# Patient Record
Sex: Female | Born: 1970
Health system: Southern US, Community
[De-identification: ages and names within clinical notes are randomized; demographics above are authoritative.]

## PROBLEM LIST (undated history)

## (undated) DIAGNOSIS — I10 Essential (primary) hypertension: Secondary | ICD-10-CM

## (undated) DIAGNOSIS — R011 Cardiac murmur, unspecified: Secondary | ICD-10-CM

## (undated) DIAGNOSIS — D219 Benign neoplasm of connective and other soft tissue, unspecified: Secondary | ICD-10-CM

## (undated) HISTORY — PX: HYSTEROSCOPY: SHX211

## (undated) HISTORY — DX: Cardiac murmur, unspecified: R01.1

## (undated) HISTORY — DX: Benign neoplasm of connective and other soft tissue, unspecified: D21.9

## (undated) HISTORY — PX: BACK SURGERY: SHX140

## (undated) HISTORY — PX: BREAST SURGERY: SHX581

## (undated) HISTORY — DX: Essential (primary) hypertension: I10

---

## 1998-01-18 ENCOUNTER — Other Ambulatory Visit: Admission: RE | Admit: 1998-01-18 | Discharge: 1998-01-18 | Payer: Self-pay | Admitting: Obstetrics and Gynecology

## 1998-03-19 ENCOUNTER — Other Ambulatory Visit: Admission: RE | Admit: 1998-03-19 | Discharge: 1998-03-19 | Payer: Self-pay | Admitting: Obstetrics and Gynecology

## 1999-01-08 ENCOUNTER — Ambulatory Visit (HOSPITAL_COMMUNITY): Admission: RE | Admit: 1999-01-08 | Discharge: 1999-01-08 | Payer: Self-pay | Admitting: Obstetrics and Gynecology

## 1999-01-08 ENCOUNTER — Encounter: Payer: Self-pay | Admitting: Obstetrics and Gynecology

## 1999-02-11 ENCOUNTER — Ambulatory Visit (HOSPITAL_COMMUNITY): Admission: RE | Admit: 1999-02-11 | Discharge: 1999-02-11 | Payer: Self-pay | Admitting: Obstetrics and Gynecology

## 1999-02-16 ENCOUNTER — Emergency Department (HOSPITAL_COMMUNITY): Admission: EM | Admit: 1999-02-16 | Discharge: 1999-02-16 | Payer: Self-pay | Admitting: Emergency Medicine

## 1999-02-16 ENCOUNTER — Encounter: Payer: Self-pay | Admitting: Emergency Medicine

## 2001-02-23 ENCOUNTER — Other Ambulatory Visit: Admission: RE | Admit: 2001-02-23 | Discharge: 2001-02-23 | Payer: Self-pay | Admitting: Obstetrics and Gynecology

## 2002-03-31 ENCOUNTER — Other Ambulatory Visit: Admission: RE | Admit: 2002-03-31 | Discharge: 2002-03-31 | Payer: Self-pay | Admitting: Obstetrics and Gynecology

## 2003-05-10 ENCOUNTER — Other Ambulatory Visit: Admission: RE | Admit: 2003-05-10 | Discharge: 2003-05-10 | Payer: Self-pay | Admitting: Obstetrics and Gynecology

## 2005-12-31 ENCOUNTER — Ambulatory Visit: Payer: Self-pay | Admitting: General Practice

## 2007-01-06 ENCOUNTER — Ambulatory Visit: Payer: Self-pay | Admitting: General Practice

## 2007-01-08 ENCOUNTER — Ambulatory Visit: Payer: Self-pay | Admitting: General Practice

## 2008-09-28 ENCOUNTER — Ambulatory Visit: Payer: Self-pay | Admitting: Gynecology

## 2008-09-28 ENCOUNTER — Other Ambulatory Visit: Admission: RE | Admit: 2008-09-28 | Discharge: 2008-09-28 | Payer: Self-pay | Admitting: Gynecology

## 2008-09-28 ENCOUNTER — Encounter: Payer: Self-pay | Admitting: Gynecology

## 2008-10-16 ENCOUNTER — Ambulatory Visit: Payer: Self-pay | Admitting: Gynecology

## 2009-06-29 ENCOUNTER — Ambulatory Visit: Payer: Self-pay | Admitting: Gynecology

## 2009-07-04 ENCOUNTER — Ambulatory Visit: Payer: Self-pay | Admitting: Gynecology

## 2009-09-05 ENCOUNTER — Ambulatory Visit (HOSPITAL_COMMUNITY): Admission: RE | Admit: 2009-09-05 | Discharge: 2009-09-05 | Payer: Self-pay | Admitting: Endocrinology

## 2010-03-06 ENCOUNTER — Ambulatory Visit: Payer: Self-pay | Admitting: Gynecology

## 2010-05-13 ENCOUNTER — Other Ambulatory Visit: Payer: Self-pay | Admitting: Gynecology

## 2010-05-13 ENCOUNTER — Other Ambulatory Visit (HOSPITAL_COMMUNITY)
Admission: RE | Admit: 2010-05-13 | Discharge: 2010-05-13 | Disposition: A | Discharge status: Home / Self Care | Payer: BC Managed Care – PPO | Source: Ambulatory Visit | Attending: Gynecology | Admitting: Gynecology

## 2010-05-13 ENCOUNTER — Encounter (INDEPENDENT_AMBULATORY_CARE_PROVIDER_SITE_OTHER): Payer: BC Managed Care – PPO | Admitting: Gynecology

## 2010-05-13 DIAGNOSIS — Z13220 Encounter for screening for lipoid disorders: Secondary | ICD-10-CM

## 2010-05-13 DIAGNOSIS — R82998 Other abnormal findings in urine: Secondary | ICD-10-CM

## 2010-05-13 DIAGNOSIS — Z124 Encounter for screening for malignant neoplasm of cervix: Secondary | ICD-10-CM | POA: Insufficient documentation

## 2010-05-13 DIAGNOSIS — Z01419 Encounter for gynecological examination (general) (routine) without abnormal findings: Secondary | ICD-10-CM

## 2010-05-13 DIAGNOSIS — Z113 Encounter for screening for infections with a predominantly sexual mode of transmission: Secondary | ICD-10-CM

## 2010-05-13 DIAGNOSIS — B373 Candidiasis of vulva and vagina: Secondary | ICD-10-CM

## 2010-05-13 DIAGNOSIS — Z833 Family history of diabetes mellitus: Secondary | ICD-10-CM

## 2010-06-02 LAB — 17-HYDROXYPREGNENOLONE
17-Hydroxycorticosteroid: 1020 ng/dL — ABNORMAL HIGH (ref <=226)
17-Hydroxycorticosteroid: 237 ng/dL — ABNORMAL HIGH (ref <=226)

## 2010-06-02 LAB — DHEA-SULFATE: DHEA-SO4: 44 ug/dL (ref 35–430)

## 2010-06-02 LAB — PROGESTERONE
Progesterone: 0.7 ng/mL
Progesterone: 0.9 ng/mL

## 2010-06-02 LAB — ANDROSTENEDIONE: Androstenedione: 89 ng/dL

## 2010-06-02 LAB — TESTOSTERONE: Testosterone: 48.9 ng/dL (ref 10–70)

## 2010-08-23 ENCOUNTER — Ambulatory Visit (INDEPENDENT_AMBULATORY_CARE_PROVIDER_SITE_OTHER): Payer: BC Managed Care – PPO | Admitting: Gynecology

## 2010-08-23 DIAGNOSIS — Z3041 Encounter for surveillance of contraceptive pills: Secondary | ICD-10-CM

## 2010-10-10 ENCOUNTER — Other Ambulatory Visit: Payer: Self-pay | Admitting: Gynecology

## 2010-10-14 ENCOUNTER — Telehealth: Payer: Self-pay | Admitting: *Deleted

## 2010-10-14 NOTE — Telephone Encounter (Signed)
PT INFORMED WITH THE BELOW NOTE.SHE SAID SHE IS GOING TO STOP BCP COMPLETELY  PT INSTUCTED TO USE BACK UP CONTRACEPTION PER TF. SHE STATES SHE WILL CALL BACK TO MAKE APPOINTMENT.

## 2010-10-14 NOTE — Telephone Encounter (Signed)
bruising is usually not an issue with birth control pills but blood clots are. I doubt that this is related. She stopped her pills so make sure she uses backup contraception and she can make an appointment to come in and we can discuss options.

## 2010-10-14 NOTE — Telephone Encounter (Signed)
PT CALLING STATING THAT SHE NOTICED A BLACK BRUISE ON HER ARM NEAR ARMPIT. IT DOES'NT HURT BUT PT STATES THAT SHE SAW THAT IT WAS A SIDE EFFECT FROM HER BCP LOESTRIN 1/20. SHE STOP TAKING HER PILL YESTERDAY DUE TO THE BRUISE. ALSO C/O SPOTTING OFF &ON AS WELL WITH BCP LMP:10/01/10 (PT LAST OV 08/23/10) PLEASE ADVISE.

## 2010-11-01 ENCOUNTER — Ambulatory Visit (INDEPENDENT_AMBULATORY_CARE_PROVIDER_SITE_OTHER): Payer: BC Managed Care – PPO | Admitting: Gynecology

## 2010-11-01 ENCOUNTER — Encounter: Payer: Self-pay | Admitting: Gynecology

## 2010-11-01 DIAGNOSIS — Z3041 Encounter for surveillance of contraceptive pills: Secondary | ICD-10-CM

## 2010-11-01 DIAGNOSIS — L819 Disorder of pigmentation, unspecified: Secondary | ICD-10-CM

## 2010-11-01 DIAGNOSIS — L811 Chloasma: Secondary | ICD-10-CM

## 2010-11-01 MED ORDER — NORETHIN-ETH ESTRAD-FE BIPHAS 1 MG-10 MCG / 10 MCG PO TABS: 1.0000 | ORAL_TABLET | Freq: Every day | ORAL | Status: DC

## 2010-11-01 NOTE — Progress Notes (Signed)
Patient presents having started on Loestrin 1/20 oral contraceptives and noticed a pigmented splotches occurring on her arms. She discontinued the pills after just being on them for short period time and these areas have resolved.   Exam: Upper extremities show one small patch of hyperpigmentation left forearm otherwise normal.  Discussed with patient the issues of chloasma and BCPs. Argument as to whether this is a estrogen versus progesterone effect. She was on a low dose pill to start and I discussed it is unusual to see it with this low dose. Options for management were reviewed to trying a different pillow either a lower dose pill or switching progesterones possibly trying NuvaRing and IUD. We both agree on trying Lo Loestrin Fe and I prescribed her a six-month course and gave her a discount card. If she has issues again with chloasma once starting these and/or other problems she will call me.

## 2011-02-10 ENCOUNTER — Ambulatory Visit (INDEPENDENT_AMBULATORY_CARE_PROVIDER_SITE_OTHER): Payer: BC Managed Care – PPO | Admitting: Gynecology

## 2011-02-10 ENCOUNTER — Encounter: Payer: Self-pay | Admitting: Gynecology

## 2011-02-10 VITALS — BP 150/90

## 2011-02-10 DIAGNOSIS — N898 Other specified noninflammatory disorders of vagina: Secondary | ICD-10-CM

## 2011-02-10 DIAGNOSIS — B373 Candidiasis of vulva and vagina: Secondary | ICD-10-CM

## 2011-02-10 DIAGNOSIS — N949 Unspecified condition associated with female genital organs and menstrual cycle: Secondary | ICD-10-CM

## 2011-02-10 DIAGNOSIS — N938 Other specified abnormal uterine and vaginal bleeding: Secondary | ICD-10-CM

## 2011-02-10 DIAGNOSIS — Z113 Encounter for screening for infections with a predominantly sexual mode of transmission: Secondary | ICD-10-CM

## 2011-02-10 DIAGNOSIS — Z3041 Encounter for surveillance of contraceptive pills: Secondary | ICD-10-CM

## 2011-02-10 DIAGNOSIS — B9689 Other specified bacterial agents as the cause of diseases classified elsewhere: Secondary | ICD-10-CM

## 2011-02-10 DIAGNOSIS — A499 Bacterial infection, unspecified: Secondary | ICD-10-CM

## 2011-02-10 DIAGNOSIS — N760 Acute vaginitis: Secondary | ICD-10-CM

## 2011-02-10 MED ORDER — METRONIDAZOLE 500 MG PO TABS: 500.0000 mg | ORAL_TABLET | Freq: Two times a day (BID) | ORAL | Status: AC

## 2011-02-10 NOTE — Progress Notes (Signed)
Patient presents wanting STD screening. Her partner has been unfaithful and she wants to be screened. She has no known exposure. She also noted some spotting intermittently on her Loestrin 120 equivalent. She has a history of mild high blood pressure at her visit in June when she started the pills it was 140/80. She never followed back up to have her rechecked as instructed.  Exam Blood pressure 150/90 Pelvic external BUS vagina with white discharge, cervix normal, uterus normal size midline mobile nontender, adnexa without masses or tenderness  Assessment and plan: 1. White discharge. KOH wet prep is positive for BV. We'll treat with Flagyl 500 twice a day x7 days alcohol with this discussed and accepted. 2. STD screening. GC Chlamydia screen was done. HIV RPR hepatitis B and hepatitis C were ordered. 3. Contraceptive management. Reviewed all the blood pressure the patient and I recommended she follow up with the internist to be evaluated and possibly treated she is having persistent hypertension. I gave her the name of the [facility] and recommended she call make an appointment to see them in follow up with them and the importance was urged. I did recommend at least at this point she stay on the birth control pill. I reviewed with her the risks of the pill in association with hypertension to include stroke heart attack DVT. I fear would be to stop the pill now she [place] pregnancy and have the combined risk of hypertension in pregnancy. I did recommend that long-term we switch her to an alternative birth control and I gave her literature on the Mirena IUD strongly urged her to consider this but also reviewed other options with her and she wants to think about this and will follow up for this but again she clearly understands my recommendation that we need to get her off of the oral contraceptive particularly if she remains with elevated blood pressures and she accepts the short-term risk of stroke heart  attack DVT but remaining on it.

## 2011-02-10 NOTE — Progress Notes (Signed)
rx called in due to escribe down. 

## 2011-02-11 LAB — HEPATITIS C ANTIBODY: HCV Ab: NEGATIVE

## 2011-02-11 LAB — HIV ANTIBODY (ROUTINE TESTING W REFLEX): HIV: NONREACTIVE

## 2011-03-18 DIAGNOSIS — D219 Benign neoplasm of connective and other soft tissue, unspecified: Secondary | ICD-10-CM

## 2011-03-18 HISTORY — DX: Benign neoplasm of connective and other soft tissue, unspecified: D21.9

## 2011-05-06 ENCOUNTER — Ambulatory Visit (INDEPENDENT_AMBULATORY_CARE_PROVIDER_SITE_OTHER): Payer: BC Managed Care – PPO | Admitting: Gynecology

## 2011-05-06 ENCOUNTER — Encounter: Payer: Self-pay | Admitting: Gynecology

## 2011-05-06 DIAGNOSIS — A499 Bacterial infection, unspecified: Secondary | ICD-10-CM

## 2011-05-06 DIAGNOSIS — N898 Other specified noninflammatory disorders of vagina: Secondary | ICD-10-CM

## 2011-05-06 DIAGNOSIS — L293 Anogenital pruritus, unspecified: Secondary | ICD-10-CM

## 2011-05-06 DIAGNOSIS — B9689 Other specified bacterial agents as the cause of diseases classified elsewhere: Secondary | ICD-10-CM

## 2011-05-06 DIAGNOSIS — N760 Acute vaginitis: Secondary | ICD-10-CM

## 2011-05-06 DIAGNOSIS — Z309 Encounter for contraceptive management, unspecified: Secondary | ICD-10-CM

## 2011-05-06 LAB — WET PREP FOR TRICH, YEAST, CLUE

## 2011-05-06 MED ORDER — METRONIDAZOLE 500 MG PO TABS: 500.0000 mg | ORAL_TABLET | Freq: Two times a day (BID) | ORAL | Status: AC

## 2011-05-06 NOTE — Progress Notes (Signed)
Patient presents with several days of vaginal itching. Also was discussed tubal sterilization.  Exam with Amy chaperone present Pelvic external BUS vagina with white discharge. Cervix normal. Uterus normal size midline mobile nontender. Adnexa without masses or tenderness  Assessment and plan: 1. Vulvar itching wet prep is positive for BV.  We'll treat with Flagyl 500 twice a day x7 days, alcohol avoidance discussed. 2. Desires sterilization. I reviewed options with her to include pill patch ring Implanon Depo-Provera Mirena IUD sterilization with vasectomy and tubal and hysteroscopic sterilization. The issues of permanency of sterilization and a regret issue was reviewed and the patient wants to proceed with tubal sterilization last I will go and schedule this. She is being seen for her blood pressure and has an appointment this week to see her doctor. We'll move toward scheduling the sterilization and discontinuing her birth control pills after.

## 2011-05-06 NOTE — Patient Instructions (Signed)
Take antibiotics as prescribed. Office will call you to arrange for tubal sterilization.

## 2011-05-07 ENCOUNTER — Telehealth: Payer: Self-pay

## 2011-05-07 NOTE — Telephone Encounter (Signed)
Patient called regarding scheduling lap tubal. I told her I had checked ins benefits and her upfront cost to [facility] is $612.  She said she has to see PCP and get her BP managed before she can have surgery per Dr. Velvet Bathe.  She said she was thinking that she would like to try to do it June or July. I told her I did not have schedule that far out but i will call her as soon as I get June schedule.  I will hold her chart and call her.

## 2011-05-07 NOTE — Telephone Encounter (Signed)
Left message pt to call me to discuss scheduling outpatient surgery.

## 2011-05-09 ENCOUNTER — Encounter: Payer: Self-pay | Admitting: Family Medicine

## 2011-05-20 ENCOUNTER — Telehealth: Payer: Self-pay

## 2011-05-20 NOTE — Telephone Encounter (Signed)
I think that that would be fine to schedule her she does not have malignant hypertension but mild elevations. Certainly not unreasonable to tie her tubes in and see what her blood pressure is off of oral contraceptives. I suspect that she will still need to be treated long term.

## 2011-05-20 NOTE — Telephone Encounter (Signed)
Patient called and said she is financially able to go ahead and do her Lap Ster now instead of waiting until June. She had previously told me she needed to follow-up with primary MD the following week regarding her BP.  You had noted back 01/2011 her persistent hypertension and stressed to her the importance of seeing primary care to possibly be put on meds.  She said she has appt with PA to have her BP rechecked and possibly go on medication but she would like to do tubal ligation first.  I asked her why and she said in case OC's are what's causing her BP elevation after Lap Ster she can d/c them and see if that makes a difference before BP meds.  I told her that I felt sure you and anesthesia will want her BP regulated before surgery but I would run it by you.

## 2011-05-21 NOTE — Telephone Encounter (Signed)
Patient informed.  She needs to work it around her boyfriend's schedule so he can bring her, etc. So she will talk with him and call me when she knows when she wants to schedule.

## 2011-05-27 ENCOUNTER — Other Ambulatory Visit: Payer: Self-pay | Admitting: Gynecology

## 2011-07-22 ENCOUNTER — Encounter: Payer: BC Managed Care – PPO | Admitting: Gynecology

## 2011-08-12 ENCOUNTER — Encounter: Payer: BC Managed Care – PPO | Admitting: Gynecology

## 2011-09-28 ENCOUNTER — Other Ambulatory Visit: Payer: Self-pay | Admitting: Gynecology

## 2011-09-29 NOTE — Telephone Encounter (Signed)
Left message for patient to call me. Told her  5 months overdue for CE and we must schedule her before I can refill one pack.  I encouraged her to schedule for a time she can keep appt as this may be the last time w can extend her RX.

## 2011-09-29 NOTE — Telephone Encounter (Signed)
Patient called and scheduled CE for next week. She is with menses this week. Encouraged her to keep appointment so Dr. Velvet Bathe can give her new OC Rx for a year.

## 2011-10-10 ENCOUNTER — Encounter: Payer: BC Managed Care – PPO | Admitting: Gynecology

## 2011-10-17 ENCOUNTER — Ambulatory Visit (INDEPENDENT_AMBULATORY_CARE_PROVIDER_SITE_OTHER): Payer: BC Managed Care – PPO | Admitting: Gynecology

## 2011-10-17 ENCOUNTER — Encounter: Payer: Self-pay | Admitting: Gynecology

## 2011-10-17 VITALS — BP 128/80 | Ht 64.5 in | Wt 191.0 lb

## 2011-10-17 DIAGNOSIS — N925 Other specified irregular menstruation: Secondary | ICD-10-CM

## 2011-10-17 DIAGNOSIS — N949 Unspecified condition associated with female genital organs and menstrual cycle: Secondary | ICD-10-CM

## 2011-10-17 DIAGNOSIS — N938 Other specified abnormal uterine and vaginal bleeding: Secondary | ICD-10-CM

## 2011-10-17 DIAGNOSIS — Z01419 Encounter for gynecological examination (general) (routine) without abnormal findings: Secondary | ICD-10-CM

## 2011-10-17 MED ORDER — DROSPIRENONE-ETHINYL ESTRADIOL 3-0.02 MG PO TABS: 1.0000 | ORAL_TABLET | Freq: Every day | ORAL | Status: DC

## 2011-10-17 NOTE — Progress Notes (Signed)
Sherry Sampson 01-Jun-1970 [id number]        41 y.o.  G1P1 for annual exam.  Several issues noted below.  Past medical history,surgical history, medications, allergies, family history and social history were all reviewed and documented in the EPIC chart. ROS:  Was performed and pertinent positives and negatives are included in the history.  Exam: Sherry Sampson assistant Filed Vitals:   10/17/11 1547  BP: 128/80  Height: 5' 4.5" (1.638 m)  Weight: 191 lb (86.637 kg)   General appearance  Normal Skin grossly normal Head/Neck normal with no cervical or supraclavicular adenopathy thyroid normal Lungs  clear Cardiac RR, without RMG Abdominal  soft, nontender, without masses, organomegaly or hernia Breasts  examined lying and sitting without masses, retractions, discharge or axillary adenopathy. Pelvic  Ext/BUS/vagina  normal   Cervix  normal with light menses flow  Uterus  anteverted, normal size, shape and contour, midline and mobile nontender   Adnexa  Without masses or tenderness    Anus and perineum  normal   Rectovaginal  normal sphincter tone without palpated masses or tenderness.    Assessment/Plan:  41 y.o. G1P1 female for annual exam.   1. Breakthrough bleeding. Patient notes her last several months midcycle breakthrough bleeding as she is doing now. Does have a history of D&C a number of years ago although cannot remember when and I do not have records of this. Currently on Lo Loestrin. Reviewed possibilities to include hormonal dysfunction versus structural such as polyps. Options for management reviewed and ultimately we decided on changing birth control pills initially. If bleeding continues then consider sonohysterogram to rule out structural abnormalities and she knows to call.  We'll change to Yaz with one-year prescription written. Had been on Desogen in the past and apparently did well with this. I reviewed with her the risks of pills in general and particularly with drospirenone  containing pills about the increased risk of stroke heart attack DVT.  She doesn't smoke and has well-controlled hypertension. Patient understands accepts the risks. 2. Hirsutism. Evaluation in the past showed normal testosterone, DHEAS, ACTH.  She did have an elevated 17 hydroxyprogesterone in the 300-400 range on repeat. She had seen Dr. Horald Pollen did a stimulation test which was reported normal and suggested electrolysis as far as the hirsutism. She is going to continue on her birth control pills and theoretically drospirenone should help as far as hirsutism. 3. Pap smear. Last Pap smear 2012. No Pap smear done today. She has no history of abnormal Pap smears before. Discussed current screening guidelines we'll plan less frequent screening every 3-5 years. 4. STD screening offered and declined. 5. Breast health. Patient reports baseline mammogram several years ago. We'll repeat at age 20. SBE monthly. 6. Health maintenance. Patient reports her primary does all of her blood work. She did have a normal glucose lipid profile CBC urinalysis last year in 2012 here. Follow up in one year, sooner as needed.    Sherry Lords MD, 4:43 PM 10/17/2011

## 2011-10-17 NOTE — Patient Instructions (Signed)
Switch to new birth control pills. Assuming bleeding resolves then we'll follow. If bleeding would continue then let me know we will schedule an ultrasound.

## 2011-10-28 ENCOUNTER — Encounter: Payer: Self-pay | Admitting: Gynecology

## 2011-10-31 ENCOUNTER — Telehealth: Payer: Self-pay | Admitting: *Deleted

## 2011-10-31 NOTE — Telephone Encounter (Signed)
I would go ahead and switch over to Yaz and if breakthrough bleeding continues then scheduled sonohysterogram. If it resolves and she has regular monthly menses then will follow.

## 2011-10-31 NOTE — Telephone Encounter (Signed)
Pt informed with the below note and will follow up as needed. 

## 2011-10-31 NOTE — Telephone Encounter (Signed)
Pt calling still c/o breakthru bleeding left side pain is much less, she never started on Yaz prescribed at OV 10/17/11. Pt continued to take the lo Loestrin because she said those pills price at $25.00 and she didn't want to throw them away. Her last pill of lo Loestrin was on Tuesday, she still has not started Yaz yet. Pt asked did you want her to schedule ultrasound as directed if bleeding continues or start on yaz? Please advise

## 2011-11-11 ENCOUNTER — Telehealth: Payer: Self-pay | Admitting: *Deleted

## 2011-11-11 DIAGNOSIS — N926 Irregular menstruation, unspecified: Secondary | ICD-10-CM

## 2011-11-11 NOTE — Telephone Encounter (Addendum)
Pt informed with the below note, order placed. 

## 2011-11-11 NOTE — Telephone Encounter (Signed)
Follow up conversation telephone encounter 10/31/11, pt started yaz on 11/01/11 no bleeding nor left side discomfort for 1 week. Pt said yesterday evening she started to have bleeding again with the left side discomfort again. Should pt schedule SHGM or continue with trial of yaz? Please advise

## 2011-11-11 NOTE — Telephone Encounter (Signed)
I would go ahead and schedule the sonohysterogram. Will allow [place] to look at her pelvis, ovaries and endometrial cavity. Continue on the yaz regardless.

## 2011-11-12 ENCOUNTER — Ambulatory Visit: Payer: BC Managed Care – PPO | Admitting: Gynecology

## 2011-11-12 ENCOUNTER — Other Ambulatory Visit: Payer: Self-pay | Admitting: Gynecology

## 2011-11-12 ENCOUNTER — Other Ambulatory Visit: Payer: BC Managed Care – PPO

## 2011-11-12 DIAGNOSIS — N926 Irregular menstruation, unspecified: Secondary | ICD-10-CM

## 2011-11-21 ENCOUNTER — Ambulatory Visit (INDEPENDENT_AMBULATORY_CARE_PROVIDER_SITE_OTHER): Payer: BC Managed Care – PPO | Admitting: Gynecology

## 2011-11-21 ENCOUNTER — Ambulatory Visit: Payer: BC Managed Care – PPO

## 2011-11-21 ENCOUNTER — Ambulatory Visit: Payer: BC Managed Care – PPO | Admitting: Gynecology

## 2011-11-21 ENCOUNTER — Encounter: Payer: Self-pay | Admitting: Gynecology

## 2011-11-21 ENCOUNTER — Ambulatory Visit (INDEPENDENT_AMBULATORY_CARE_PROVIDER_SITE_OTHER): Payer: BC Managed Care – PPO

## 2011-11-21 DIAGNOSIS — N949 Unspecified condition associated with female genital organs and menstrual cycle: Secondary | ICD-10-CM

## 2011-11-21 DIAGNOSIS — D259 Leiomyoma of uterus, unspecified: Secondary | ICD-10-CM

## 2011-11-21 DIAGNOSIS — N926 Irregular menstruation, unspecified: Secondary | ICD-10-CM

## 2011-11-21 DIAGNOSIS — N938 Other specified abnormal uterine and vaginal bleeding: Secondary | ICD-10-CM

## 2011-11-21 NOTE — Progress Notes (Signed)
Patient follows up for sonohysterogram due to persistent irregular bleeding. She just recently switched to Yaz birth-control pills 2 weeks ago. Ultrasound initially shows normal uterine size and echotexture. 2 small myomas 11 x 7 mm and 10 x 7 mm.  Endometrial echo 4.8 mm. Right and left ovaries visualized and normal. Cul-de-sac negative. Sonohysterogram performed, sterile technique, easy catheter introduction, good distention with no abnormalities. Endometrial sample taken. Patient tolerated well.  Assessment and plan: Dysfunctional bleeding recent switching birth control pills. Ultrasound overall negative with small incidental myomas. She'll follow up for biopsy results. Assuming acceptable will keep menstrual calendar on new birth control pill over the next 2 cycles call if irregular bleeding continues.

## 2011-11-21 NOTE — Patient Instructions (Signed)
Office will call you with the biopsy results 

## 2012-02-19 ENCOUNTER — Telehealth: Payer: Self-pay | Admitting: *Deleted

## 2012-02-19 MED ORDER — NORETHINDRONE-ETH ESTRADIOL 1-35 MG-MCG PO TABS: 1.0000 | ORAL_TABLET | Freq: Every day | ORAL | Status: DC

## 2012-02-19 NOTE — Telephone Encounter (Signed)
Pt called back to follow regarding bleeding while on Yaz birth control pill. Pt said she is still spotting & bleeding a 1 1/2 week prior to when her period should start. She is requesting to start on different pill where she could have cycle only every 3 months?  Pt states " I am sick of bleeding" please advise

## 2012-02-19 NOTE — Telephone Encounter (Signed)
Recommend trial of a little higher dose pill with Ortho-Novum 1/35 equivalent. Trial of 3 months with call back at that time to see how she's doing. If doing well then we'll refill her through her next annual.

## 2012-02-20 NOTE — Telephone Encounter (Signed)
Pt informed with the below note and will call back as directed.

## 2012-03-15 ENCOUNTER — Telehealth: Payer: Self-pay | Admitting: *Deleted

## 2012-03-15 NOTE — Telephone Encounter (Signed)
Finish pack she is on and then start the appropriate Ortho-Novum 1/35 equivalent with her next pack.

## 2012-03-15 NOTE — Telephone Encounter (Signed)
Pt was given Rx for Ortho-Novum 1/35 equivalent on 02/19/12. She took only 1 pack of these pills. Pharmacy gave pt wrong pills 2 packs of her old Rx  Yaz and pt didn't realize this until now. She took 1 whole pack of yaz and just started today her 2 pack of yaz. She still having irregular bleeding as noted on 02/19/12. Please advise how pt should handle.

## 2012-03-15 NOTE — Telephone Encounter (Signed)
Pt informed with the below note. 

## 2012-03-24 ENCOUNTER — Telehealth: Payer: Self-pay | Admitting: *Deleted

## 2012-03-24 NOTE — Telephone Encounter (Signed)
Pt called c/o heavy bleeding today and stomach swelling. Ov advised for pt.

## 2012-04-05 ENCOUNTER — Encounter: Payer: Self-pay | Admitting: Gynecology

## 2012-04-05 ENCOUNTER — Ambulatory Visit (INDEPENDENT_AMBULATORY_CARE_PROVIDER_SITE_OTHER): Payer: PRIVATE HEALTH INSURANCE | Admitting: Gynecology

## 2012-04-05 DIAGNOSIS — N938 Other specified abnormal uterine and vaginal bleeding: Secondary | ICD-10-CM

## 2012-04-05 DIAGNOSIS — N949 Unspecified condition associated with female genital organs and menstrual cycle: Secondary | ICD-10-CM

## 2012-04-05 NOTE — Patient Instructions (Signed)
Continue on birth control pills. Call if irregular bleeding continues. Follow up in August for annual exam.

## 2012-04-05 NOTE — Progress Notes (Signed)
Patient presents complaining of an episode of heavy vaginal bleeding. We saw her in August 2013 with some breakthrough bleeding. She ultimately had a negative sonohysterogram with negative endometrial biopsy. She was placed on Yaz times several cycles but continued to have the breakthrough bleeding and we ultimately switched her to Ortho-Novum 1/35's but she has not started these yet to a mixup in the pharmacy and she had continued on the as and is at the very end of this pack. Most recently she started to have some breakthrough bleeding and passed a large clot which scared her into this appointment.  Exam with Kim assistant Abdomen soft nontender without masses guarding rebound organomegaly. Pelvic external BUS vagina normal. Cervix normal. Uterus normal size midline mobile nontender. Adnexa without masses or tenderness.  Assessment and plan:  Breakthrough bleeding on birth control pills. Sonohysterogram and endometrial biopsy negative. Had been placed on Yaz and then switched to Ortho-Novum 1/35 but has not done this yet. She is at the end of her Yaz pac. I recommended that as she is no longer bleeding to start on the Ortho-Novum 1/35 and then we'll see how she does over the first couple cycles.  If she continues to bleed irregularly she'll represent for evaluation. If she has regular menses and will follow up in August when she is in for her annual.

## 2012-04-15 ENCOUNTER — Telehealth: Payer: Self-pay | Admitting: *Deleted

## 2012-04-15 NOTE — Telephone Encounter (Signed)
Pt informed with the below note and will follow up if needed.

## 2012-04-15 NOTE — Telephone Encounter (Signed)
Pt is calling to follow regarding irregular bleeding. Pt has started the ortho-novum about a week ago, and started spotting this a.m. It is not a cycle flow, only spotting. Pt asked this to be relayed to you. Please advise

## 2012-04-15 NOTE — Telephone Encounter (Signed)
She is just started on a new type of pill understand these for now do not stop them and we'll see how she does in one to 2 months. Sometimes breakthrough bleeding is not unusual when starting a new type of pill.

## 2012-05-17 ENCOUNTER — Telehealth: Payer: Self-pay | Admitting: *Deleted

## 2012-05-17 MED ORDER — NORETHINDRONE-ETH ESTRADIOL 1-35 MG-MCG PO TABS: 1.0000 | ORAL_TABLET | Freq: Every day | ORAL | Status: DC

## 2012-05-17 NOTE — Telephone Encounter (Signed)
Pt informed, Rx sent. 

## 2012-05-17 NOTE — Telephone Encounter (Signed)
Pt is calling to follow with last telephone encounter 04/15/12. She is requesting refill on Ortho-Novum 1/35, but she still having breakthrough bleeding. Pt said it would stop for about 3 days and then start again. But this morning she filled up her panty liner. She has not be bleeding heavy since taking pills, only this am would be the first time were bleeding was more. Please advise

## 2012-05-17 NOTE — Telephone Encounter (Signed)
Refill x6 were to get her to August when she is due for her annual. If she still continues to have breakthrough bleeding after several packs Ortho-Novum 135 call.

## 2012-05-28 ENCOUNTER — Ambulatory Visit: Payer: PRIVATE HEALTH INSURANCE | Admitting: Gynecology

## 2012-06-17 ENCOUNTER — Telehealth: Payer: Self-pay | Admitting: *Deleted

## 2012-06-17 MED ORDER — NORGESTIM-ETH ESTRAD TRIPHASIC 0.18/0.215/0.25 MG-35 MCG PO TABS: 1.0000 | ORAL_TABLET | Freq: Every day | ORAL | Status: DC

## 2012-06-17 NOTE — Telephone Encounter (Signed)
Pt informed with the below note. 

## 2012-06-17 NOTE — Telephone Encounter (Signed)
Pt informed with the below note, she asked if you would like her to continue taking her 2nd pack? Or stop and start new triphasic pill?

## 2012-06-17 NOTE — Telephone Encounter (Signed)
I would recommend switching to a triphasic pill. If her irregular bleeding continues recommend office visit and we'll discuss next step. I think ultimately wound up having to do a D&C. But would skip one more try with a different type of pill. We'll try Ortho Tri-Cyclen equivalent and see how she does with that. If her bleeding goes away and she has regular withdrawal menses with no intermenstrual spotting and I will see her August when she is due for her annual

## 2012-06-17 NOTE — Telephone Encounter (Signed)
Left message for pt to call.

## 2012-06-17 NOTE — Telephone Encounter (Signed)
Pt is calling to follow up from telephone encounter 05/17/12. Pt said is still having spotting and then bleeding while taking pills, she has never completely stop bleeding for 3 months now. She is taking her birth control pill Ortho-Novum 135 daily on time as directed. Please advise

## 2012-06-17 NOTE — Telephone Encounter (Signed)
I would switch her over and start the new pills. Also recommend backup contraception first pack of switch over

## 2012-10-11 ENCOUNTER — Emergency Department: Payer: Self-pay | Admitting: Emergency Medicine

## 2012-10-11 LAB — URINALYSIS, COMPLETE
Bilirubin,UR: NEGATIVE
Glucose,UR: NEGATIVE mg/dL (ref 0–75)
Nitrite: NEGATIVE
WBC UR: 4 /HPF (ref 0–5)

## 2012-10-11 LAB — PREGNANCY, URINE: Pregnancy Test, Urine: NEGATIVE m[IU]/mL

## 2012-10-25 ENCOUNTER — Telehealth: Payer: Self-pay | Admitting: *Deleted

## 2012-10-25 MED ORDER — NORGESTIM-ETH ESTRAD TRIPHASIC 0.18/0.215/0.25 MG-35 MCG PO TABS: 1.0000 | ORAL_TABLET | Freq: Every day | ORAL | Status: DC

## 2012-10-25 NOTE — Telephone Encounter (Signed)
Pt called requesting a 3 month supply for Ortho Tri-Cyclen pills, rx sent. Annual schedule 12/07/12.

## 2012-10-27 ENCOUNTER — Other Ambulatory Visit: Payer: Self-pay

## 2012-10-27 MED ORDER — NORGESTIM-ETH ESTRAD TRIPHASIC 0.18/0.215/0.25 MG-35 MCG PO TABS: 1.0000 | ORAL_TABLET | Freq: Every day | ORAL | Status: DC

## 2012-10-27 NOTE — Telephone Encounter (Signed)
Has yearly exam scheduled in September.

## 2012-12-07 ENCOUNTER — Encounter: Payer: Self-pay | Admitting: Gynecology

## 2013-01-13 ENCOUNTER — Encounter: Payer: Self-pay | Admitting: Gynecology

## 2013-01-13 ENCOUNTER — Ambulatory Visit (INDEPENDENT_AMBULATORY_CARE_PROVIDER_SITE_OTHER): Payer: PRIVATE HEALTH INSURANCE | Admitting: Gynecology

## 2013-01-13 VITALS — BP 120/80 | Ht 64.0 in | Wt 188.0 lb

## 2013-01-13 DIAGNOSIS — Z113 Encounter for screening for infections with a predominantly sexual mode of transmission: Secondary | ICD-10-CM

## 2013-01-13 DIAGNOSIS — Z01419 Encounter for gynecological examination (general) (routine) without abnormal findings: Secondary | ICD-10-CM

## 2013-01-13 DIAGNOSIS — B9689 Other specified bacterial agents as the cause of diseases classified elsewhere: Secondary | ICD-10-CM

## 2013-01-13 DIAGNOSIS — N760 Acute vaginitis: Secondary | ICD-10-CM

## 2013-01-13 DIAGNOSIS — D251 Intramural leiomyoma of uterus: Secondary | ICD-10-CM

## 2013-01-13 DIAGNOSIS — N898 Other specified noninflammatory disorders of vagina: Secondary | ICD-10-CM

## 2013-01-13 DIAGNOSIS — A499 Bacterial infection, unspecified: Secondary | ICD-10-CM

## 2013-01-13 LAB — WET PREP FOR TRICH, YEAST, CLUE
Trich, Wet Prep: NONE SEEN
Yeast Wet Prep HPF POC: NONE SEEN

## 2013-01-13 MED ORDER — NORGESTIM-ETH ESTRAD TRIPHASIC 0.18/0.215/0.25 MG-35 MCG PO TABS: 1.0000 | ORAL_TABLET | Freq: Every day | ORAL | Status: DC

## 2013-01-13 MED ORDER — METRONIDAZOLE 500 MG PO TABS: 500.0000 mg | ORAL_TABLET | Freq: Two times a day (BID) | ORAL | Status: DC

## 2013-01-13 NOTE — Patient Instructions (Signed)
Call to Schedule your mammogram  Facilities in [place]: 1)  [facility], [address] Phone: [phone] 2)  [facility], [address] Phone: [phone] 3)  Dr. Bertrand at [facility]  [address] Phone: [phone]     Mammogram A mammogram is an X-ray test to find changes in a woman's breast. You should get a mammogram if:  You are 40 years of age or older  You have risk factors.   Your doctor recommends that you have one.  BEFORE THE TEST  Do not schedule the test the week before your period, especially if your breasts are sore during this time.  On the day of your mammogram:  Wash your breasts and armpits well. After washing, do not put on any deodorant or talcum powder on until after your test.   Eat and drink as you usually do.   Take your medicines as usual.   If you are diabetic and take insulin, make sure you:   Eat before coming for your test.   Take your insulin as usual.   If you cannot keep your appointment, call before the appointment to cancel. Schedule another appointment.  TEST  You will need to undress from the waist up. You will put on a hospital gown.   Your breast will be put on the mammogram machine, and it will press firmly on your breast with a piece of plastic called a compression paddle. This will make your breast flatter so that the machine can X-ray all parts of your breast.   Both breasts will be X-rayed. Each breast will be X-rayed from above and from the side. An X-ray might need to be taken again if the picture is not good enough.   The mammogram will last about 15 to 30 minutes.  AFTER THE TEST Finding out the results of your test Ask when your test results will be ready. Make sure you get your test results.  Document Released: 05/30/2008 Document Revised: 02/20/2011 Document Reviewed: 05/30/2008 [employer] Patient  Information 2012 [employer].   

## 2013-01-13 NOTE — Progress Notes (Signed)
Sherry Sampson 1970-09-21 [id number]        42 y.o.  G1P1 for annual exam.  Several issues noted below.  Past medical history,surgical history, problem list, medications, allergies, family history and social history were all reviewed and documented in the EPIC chart.  ROS:  Performed and pertinent positives and negatives are included in the history, assessment and plan .  Exam: Kim assistant Filed Vitals:   01/13/13 1357  BP: 120/80  Height: 5\' 4"  (1.626 m)  Weight: 188 lb (85.276 kg)   General appearance  Normal Skin grossly normal Head/Neck normal with no cervical or supraclavicular adenopathy thyroid normal Lungs  clear Cardiac RR, without RMG Abdominal  soft, nontender, without masses, organomegaly or hernia Breasts  examined lying and sitting without masses, retractions, discharge or axillary adenopathy. Pelvic  Ext/BUS/vagina  normal with white discharge.  Cervix  normal GC/Chlamydia  Uterus  anteverted, normal size, shape and contour, midline and mobile nontender   Adnexa  Without masses or tenderness    Anus and perineum  normal   Rectovaginal  normal sphincter tone without palpated masses or tenderness.    Assessment/Plan:  42 y.o. G1P1 female for annual exam, regular menses, oral contraceptives.   1. Vaginal discharge in the last several weeks. Slight odor and irritation. Wet prep consistent with bacterial vaginosis. Treat with Flagyl 500 mg twice a day x7 days, alcohol avoidance reviewed. 2. History of small leiomyomata. Uterine exam is normal. We'll continue to monitor with annual exams. 3. History of irregular bleeding. This has resolved on the Ortho Tri-Cyclen equivalent. Wants to continue and I refilled her x1 year. 4. STD screening. Patient requests STD screening. No known exposure but just wants to be screened. GC/Chlamydia, HIV, RPR, hepatitis B, hepatitis C done. 5. Mammography never. Recommended screening mammogram now she's turned 42. Patient agrees to  schedule. SBE monthly reviewed. 6. Pap smear 2012. No Pap smear done today. No history of significant abnormal Pap smears. Repeat Pap smear next year at 3 year interval. 7. Health maintenance. No routine blood work done as she reports having this done through a work program. Followup one year, sooner as needed.  Note: This document was prepared with digital dictation and possible smart phrase technology. Any transcriptional errors that result from this process are unintentional.   Dara Lords MD, 2:59 PM 01/13/2013

## 2013-01-14 LAB — URINALYSIS W MICROSCOPIC + REFLEX CULTURE
Casts: NONE SEEN
Glucose, UA: NEGATIVE mg/dL
Specific Gravity, Urine: 1.008 (ref 1.005–1.030)
Squamous Epithelial / LPF: NONE SEEN
pH: 6.5 (ref 5.0–8.0)

## 2013-01-18 ENCOUNTER — Telehealth: Payer: Self-pay | Admitting: *Deleted

## 2013-01-18 NOTE — Telephone Encounter (Signed)
Pt informed with 01/13/13 lab results.

## 2013-01-20 ENCOUNTER — Other Ambulatory Visit: Payer: Self-pay

## 2013-04-06 ENCOUNTER — Telehealth: Payer: Self-pay

## 2013-04-06 NOTE — Telephone Encounter (Signed)
Patient had called asking about how much endometrial ablation costed.  I called her back and left message. I told her Dr. Loetta Rough had not indicated in her chart that this would be an appropriate treatment for her and at her Oct CE he noted that the OC's had resolved her bleeding problems.  I told her with that said, I had checked her ins benefits and it is covered in the office 100% after a $25 copayment.  I told her that Dr. Loetta Rough indicated that if she continued to have irregular bleeding issues she should return for office visit to discuss plan.  I encouraged her to do that if she is having issues and to call me if she had any further questions.

## 2013-04-25 ENCOUNTER — Ambulatory Visit: Payer: PRIVATE HEALTH INSURANCE | Admitting: Gynecology

## 2013-05-13 ENCOUNTER — Ambulatory Visit (INDEPENDENT_AMBULATORY_CARE_PROVIDER_SITE_OTHER): Payer: PRIVATE HEALTH INSURANCE | Admitting: Family Medicine

## 2013-05-13 VITALS — BP 124/80 | HR 70 | Temp 98.6°F | Resp 16 | Ht 64.0 in | Wt 188.4 lb

## 2013-05-13 DIAGNOSIS — IMO0002 Reserved for concepts with insufficient information to code with codable children: Secondary | ICD-10-CM

## 2013-05-13 DIAGNOSIS — L03114 Cellulitis of left upper limb: Secondary | ICD-10-CM

## 2013-05-13 DIAGNOSIS — E669 Obesity, unspecified: Secondary | ICD-10-CM | POA: Insufficient documentation

## 2013-05-13 DIAGNOSIS — L03113 Cellulitis of right upper limb: Secondary | ICD-10-CM

## 2013-05-13 MED ORDER — CEPHALEXIN 500 MG PO CAPS: 500.0000 mg | ORAL_CAPSULE | Freq: Two times a day (BID) | ORAL | Status: DC

## 2013-05-13 NOTE — Progress Notes (Signed)
[facility] [address] [place] [address] [phone]  Date:  05/13/2013   Name:  Sherry Sampson   DOB:  07-22-1970   MRN:  [id number]  PCP:  No primary provider on file.    Chief Complaint: Insect Bite   History of Present Illness:  Sherry Sampson is a 43 y.o. very pleasant female patient who presents with the following:  She awoke this am and noted a tender and swollen spot on her right forearm.  She thinks something might have bitten her during the night.  The whole right arm aches.  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  She did not notice much difference with this.   No other "bites" on her body that she has noted.   Otherwise she feels well- no fever, chills, cough, etc.   She is generally healthy.  Takes her OCP but no other meds.   There are no active problems to display for this patient.   Past Medical History  Diagnosis Date  . Hypertension   . Leiomyoma 2013    11x49mm, 10x29mm    Past Surgical History  Procedure Laterality Date  . Hysteroscopy      D & C  . Breast surgery      BREAST REDUCTION  . Back surgery      History  Substance Use Topics  . Smoking status: Never Smoker   . Smokeless tobacco: Never Used  . Alcohol Use: No    Family History  Problem Relation Age of Onset  . Diabetes Maternal Grandmother   . Cancer Paternal Grandmother     COLON  . Hypertension Father     No Known Allergies  Medication list has been reviewed and updated.  Current Outpatient Prescriptions on File Prior to Visit  Medication Sig Dispense Refill  . Norgestimate-Ethinyl Estradiol Triphasic 0.18/0.215/0.25 MG-35 MCG tablet Take 1 tablet by mouth daily.  3 Package  4  . metroNIDAZOLE (FLAGYL) 500 MG tablet Take 1 tablet (500 mg total) by mouth 2 (two) times daily. For 7 days.  Avoid alcohol while taking  14 tablet  0   No current facility-administered  medications on file prior to visit.    Review of Systems:  As per HPI- otherwise negative.   Physical Examination: Filed Vitals:   05/13/13 1251  BP: 138/88  Pulse: 70  Temp: 98.6 F (37 C)  Resp: 16   Filed Vitals:   05/13/13 1251  Height: 5\' 4"  (1.626 m)  Weight: 188 lb 6.4 oz (85.458 kg)   Body mass index is 32.32 kg/(m^2). Ideal Body Weight: Weight in (lb) to have BMI = 25: 145.3  GEN: WDWN, NAD, Non-toxic, A & O x 3, obese, looks well HEENT: Atraumatic, Normocephalic. Neck supple. No masses, No LAD.  No oral lesions Ears and Nose: No external deformity. CV: RRR, No M/G/R. No JVD. No thrill. No extra heart sounds. PULM: CTA B, no wheezes, crackles, rhonchi. No retractions. No resp. distress. No accessory muscle use. EXTR: No c/c/e NEURO Normal gait.  PSYCH: Normally interactive. Conversant. Not depressed or anxious appearing.  Calm demeanor.  Right arm: she has a tender, discrete, indurated area on the medial right forearm. About the size of a tennis ball in diameter.  No fluctuance.  Normal ROM and movement of the joints in her right shoulder and arm.  Right hand  NV intact No other lesions on her body  Assessment and Plan: Cellulitis of left arm - Plan: cephALEXin (KEFLEX) 500 MG capsule  Cellulitis of right arm  Pt has cellulitis of RIGHT arm- left entered in error but I cannot remove.  Will treat with keflex.  Aching of her arm is somewhat unusual but I do not see evidence of dangerous pathology.  No CP, no SOB per her report.  She will follow-up closely if this continues Signed Lamar Blinks, MD

## 2013-05-13 NOTE — Patient Instructions (Signed)
Use the keflex for your right arm bite- it may be slightly infected.  Continue to use benadryl cream as needed ,and try applying ice to the area.  Ibuprofen or aleve is also ok. At night you might try some benadryl pills to help with itching and sleep.    If you are getting worse or are not better in the next couple of days please let me know- Sooner if worse.   Use the keflex antibiotic twice a day for one week

## 2013-06-29 ENCOUNTER — Encounter: Payer: Self-pay | Admitting: Gynecology

## 2013-06-29 ENCOUNTER — Ambulatory Visit (INDEPENDENT_AMBULATORY_CARE_PROVIDER_SITE_OTHER): Payer: PRIVATE HEALTH INSURANCE | Admitting: Gynecology

## 2013-06-29 DIAGNOSIS — N921 Excessive and frequent menstruation with irregular cycle: Secondary | ICD-10-CM

## 2013-06-29 DIAGNOSIS — N920 Excessive and frequent menstruation with regular cycle: Secondary | ICD-10-CM

## 2013-06-29 DIAGNOSIS — N951 Menopausal and female climacteric states: Secondary | ICD-10-CM

## 2013-06-29 LAB — CBC WITH DIFFERENTIAL/PLATELET
BASOS ABS: 0 10*3/uL (ref 0.0–0.1)
Basophils Relative: 0 % (ref 0–1)
EOS ABS: 0.1 10*3/uL (ref 0.0–0.7)
EOS PCT: 1 % (ref 0–5)
HEMATOCRIT: 38.1 % (ref 36.0–46.0)
HEMOGLOBIN: 13.1 g/dL (ref 12.0–15.0)
Lymphocytes Relative: 47 % — ABNORMAL HIGH (ref 12–46)
Lymphs Abs: 3.6 10*3/uL (ref 0.7–4.0)
MCH: 30 pg (ref 26.0–34.0)
MCHC: 34.4 g/dL (ref 30.0–36.0)
MCV: 87.2 fL (ref 78.0–100.0)
MONOS PCT: 5 % (ref 3–12)
Monocytes Absolute: 0.4 10*3/uL (ref 0.1–1.0)
Neutro Abs: 3.6 10*3/uL (ref 1.7–7.7)
Neutrophils Relative %: 47 % (ref 43–77)
Platelets: 280 10*3/uL (ref 150–400)
RBC: 4.37 MIL/uL (ref 3.87–5.11)
RDW: 13.6 % (ref 11.5–15.5)
WBC: 7.7 10*3/uL (ref 4.0–10.5)

## 2013-06-29 LAB — PROLACTIN: Prolactin: 8.9 ng/mL

## 2013-06-29 LAB — FOLLICLE STIMULATING HORMONE: FSH: 5.9 m[IU]/mL

## 2013-06-29 LAB — TSH: TSH: 2.154 u[IU]/mL (ref 0.350–4.500)

## 2013-06-29 NOTE — Progress Notes (Signed)
SHAHIDAH NESBITT Feb 09, 1971 [id number]        43 y.o.  G1P1 patient presents complaining of continued irregular bleeding. Has a history for 2 years of irregular bleeding on and off despite being on oral contraceptives. Has episodes of breakthrough bleeding almost monthly as well as prolonged menses. Her most recent episode has lasted 2 weeks now with bleeding on and off. Has continued on oral contraceptives with several changes in formulation. Had negative sonohysterogram 11/2011 excepting several small myomas with a negative endometrial biopsy. Patient is becoming frustrated and wants to talk about alternatives.  Past medical history,surgical history, problem list, medications, allergies, family history and social history were all reviewed and documented in the EPIC chart.  Directed ROS to system complaints/issues with pertinent positives and negatives documented in the history of present illness/assessment and plan.  Exam: Kim assistant General appearance  Normal Abdomen soft nontender without masses guarding rebound organomegaly Pelvic external BUS vagina with slight bleeding. Cervix normal. Uterus grossly normal size, mobile nontender. Adnexa without masses or tenderness.  Assessment/Plan:  43 y.o. G1P1 with continued irregular bleeding for over a year and a half on oral contraceptives. Negative sonohysterogram and endometrial biopsy. Does note some hot flashes and sweats. We'll check baseline FSH TSH prolactin. At this point as it's a continuing issue I do not think repeat sonohysterogram as necessary but will schedule routine ultrasound to reevaluate the leiomyoma. Options for management include trying a different birth control pill, more aggressive hormonal manipulation, Mirena IUD, endometrial ablation, hysterectomy discussed. Patient seriously considering hysterectomy for absolute cessation of her bleeding. Childbearing is no longer an issue and in fact she was asking about how having her tubes  tied regardless. Which involved generally with hysterectomy discussed to include attempted vaginal possible laparoscopic and general risks of infection, internal organ damage, transfusion all reviewed. Patient will followup for the blood results and ultrasound and she will think of her options and we'll rediscuss at that time..   Note: This document was prepared with digital dictation and possible smart phrase technology. Any transcriptional errors that result from this process are unintentional.   Anastasio Auerbach MD, 10:50 AM 06/29/2013

## 2013-06-29 NOTE — Patient Instructions (Signed)
Follow up for ultrasound as scheduled 

## 2013-07-04 ENCOUNTER — Telehealth: Payer: Self-pay | Admitting: *Deleted

## 2013-07-04 NOTE — Telephone Encounter (Signed)
Pt called asking if TF wanted to switch pills from last office visit, I told patient per note patient was "Patient will followup for the blood results and ultrasound and she will think of her options and we'll rediscuss at that time" pt will continue taking prescribed pills

## 2013-07-11 ENCOUNTER — Encounter: Payer: Self-pay | Admitting: Gynecology

## 2013-07-11 ENCOUNTER — Ambulatory Visit (INDEPENDENT_AMBULATORY_CARE_PROVIDER_SITE_OTHER): Payer: PRIVATE HEALTH INSURANCE

## 2013-07-11 ENCOUNTER — Other Ambulatory Visit: Payer: Self-pay | Admitting: Gynecology

## 2013-07-11 ENCOUNTER — Ambulatory Visit (INDEPENDENT_AMBULATORY_CARE_PROVIDER_SITE_OTHER): Payer: PRIVATE HEALTH INSURANCE | Admitting: Gynecology

## 2013-07-11 DIAGNOSIS — N920 Excessive and frequent menstruation with regular cycle: Secondary | ICD-10-CM

## 2013-07-11 DIAGNOSIS — D251 Intramural leiomyoma of uterus: Secondary | ICD-10-CM

## 2013-07-11 DIAGNOSIS — N83209 Unspecified ovarian cyst, unspecified side: Secondary | ICD-10-CM

## 2013-07-11 DIAGNOSIS — D259 Leiomyoma of uterus, unspecified: Secondary | ICD-10-CM

## 2013-07-11 DIAGNOSIS — N926 Irregular menstruation, unspecified: Secondary | ICD-10-CM

## 2013-07-11 DIAGNOSIS — N8300 Follicular cyst of ovary, unspecified side: Secondary | ICD-10-CM

## 2013-07-11 NOTE — Progress Notes (Signed)
Sherry Sampson 02-Jul-1970 [id number]        43 y.o.  G1P1 presents with her husband for ultrasound. Has a several year history of irregular heavy menses. Has been on several different types of oral contraceptives without successful control of her bleeding. Had negative sonohysterogram and biopsy 11/2011. Several small myomas were noted at that time. I reviewed options for management as noted my 06/29/2013 note and presents now to rediscuss these options and ultrasound.  Past medical history,surgical history, problem list, medications, allergies, family history and social history were all reviewed and documented in the EPIC chart.  Directed ROS with pertinent positives and negatives documented in the history of present illness/assessment and plan.  Ultrasound shows uterus normal size. Endometrial echo 3.2 mm. Right and left ovaries with physiologic cystic changes. Cul-de-sac negative.  Assessment/Plan:  43 y.o. G1P1 persistent irregular heavy at times menses. Several small myomas. Sonohysterogram negative. Recent hormonal studies to include TSH FSH prolactin negative with hemoglobin 13.1. I again reviewed options with the patient to include continued hormone manipulation to include combination pills versus progesterone only, trial of Mirena IUD, endometrial ablation and hysterectomy. Patient has thought about and wants to proceed with hysterectomy. I reviewed with involved with that in general to include the absolute irreversible sterility as well as the acute intraoperative and postoperative risks. Infection, transfusion, damage to surrounding tissues were all reviewed. Risk reductive salpingectomy at the time of hysterectomy also discussed. Would recommend retaining both ovaries for continued hormone production recognizing continued risk for ovarian cancer and benign ovarian disease possibly requiring surgery in the future. Also the persistence of hormonal-type symptoms such as breast tenderness and other  molininal symptoms. Patient wants to proceed with scheduling her surgery later in the summer and will followup preoperatively for a full preop consult before hand. We will tentatively post as an LAVH may convert to a TVH after reexamination and assessment of uterine descent.    Note: This document was prepared with digital dictation and possible smart phrase technology. Any transcriptional errors that result from this process are unintentional.   Anastasio Auerbach MD, 11:54 AM 07/11/2013

## 2013-07-11 NOTE — Patient Instructions (Signed)
Office will contact you to arrange surgery. 

## 2013-07-13 ENCOUNTER — Telehealth: Payer: Self-pay

## 2013-07-13 NOTE — Telephone Encounter (Signed)
I left message for patient to call me to discuss her ins benefits and scheduling surgery.

## 2013-07-13 NOTE — Telephone Encounter (Signed)
Patient called regarding scheduling surgery. We discussed ins benefits/estimated surgery prepymt.   Patient said this is going to be best for her to schedule toward the end of August.  I will call her with the August schedule and we will schedule then.

## 2013-09-02 ENCOUNTER — Telehealth: Payer: Self-pay | Admitting: *Deleted

## 2013-09-02 MED ORDER — NORGESTIM-ETH ESTRAD TRIPHASIC 0.18/0.215/0.25 MG-35 MCG PO TABS: 1.0000 | ORAL_TABLET | Freq: Every day | ORAL | Status: DC

## 2013-09-02 NOTE — Telephone Encounter (Signed)
(  pt aware you are out of the office, asked that I send message to you not other providers) Pt takes Ortho Tri-Cyclen equivalent c/o bleeding since 3rd week of may. Pt said medium flow changes pad every 1 1/2 ( to keep clean) she asked if she should go on higher dose. I explained to patient this may be the highest dose you may be willing to prescribe due to age, but I would pass along to you. She would like recommendations? Pt needs refill as well, I will send. Please advise

## 2013-09-02 NOTE — Telephone Encounter (Signed)
Per telephone encounter note on 4/29/1Patient called regarding scheduling surgery. We discussed ins benefits/estimated surgery prepayment. Patient said this is going to be best for her to schedule toward the end of August. I will call her with the August schedule and we will schedule then.

## 2013-09-02 NOTE — Telephone Encounter (Signed)
Per 07/11/2013 note we discussed various options and I thought she was wanting to proceed with hysterectomy.  Mirena IUD would be a good option if she didn't want the hysterectomy.

## 2013-09-03 NOTE — Telephone Encounter (Signed)
For short term solution, either stay on the current pill or switch to a monophasic such as sprintec.

## 2013-09-05 MED ORDER — NORGESTIMATE-ETH ESTRADIOL 0.25-35 MG-MCG PO TABS: 1.0000 | ORAL_TABLET | Freq: Every day | ORAL | Status: DC

## 2013-09-05 NOTE — Telephone Encounter (Signed)
Pt informed, rx sent for sprintec.

## 2013-09-07 ENCOUNTER — Telehealth: Payer: Self-pay | Admitting: *Deleted

## 2013-09-07 MED ORDER — NORGESTIMATE-ETH ESTRADIOL 0.25-35 MG-MCG PO TABS: 1.0000 | ORAL_TABLET | Freq: Every day | ORAL | Status: DC

## 2013-09-07 NOTE — Telephone Encounter (Signed)
Pt said pharmacy never received sprintec RX this was resent to pharmacy.

## 2013-09-14 ENCOUNTER — Telehealth: Payer: Self-pay

## 2013-09-14 NOTE — Telephone Encounter (Signed)
I had called patient to let her know August schedule ready as a few months ago she indicated she would want to wait until August.  She called me back and told me she needed to figure out her finances and also time off from work and when she does she will call me back when ready to schedule.

## 2013-11-08 ENCOUNTER — Ambulatory Visit (HOSPITAL_COMMUNITY): Admission: RE | Admit: 2013-11-08 | Payer: PRIVATE HEALTH INSURANCE | Source: Ambulatory Visit | Admitting: Gynecology

## 2013-11-08 ENCOUNTER — Encounter (HOSPITAL_COMMUNITY): Admission: RE | Payer: Self-pay | Source: Ambulatory Visit

## 2013-11-08 SURGERY — HYSTERECTOMY, VAGINAL, LAPAROSCOPY-ASSISTED
Anesthesia: General | Laterality: Bilateral

## 2013-11-10 ENCOUNTER — Other Ambulatory Visit: Payer: Self-pay | Admitting: Gynecology

## 2013-11-10 ENCOUNTER — Other Ambulatory Visit: Payer: Self-pay | Admitting: *Deleted

## 2013-11-10 MED ORDER — NORGESTIMATE-ETH ESTRADIOL 0.25-35 MG-MCG PO TABS: 1.0000 | ORAL_TABLET | Freq: Every day | ORAL | Status: DC

## 2013-11-10 NOTE — Telephone Encounter (Signed)
Pt called to follow up has been doing great on Sprintec, no further bleeding problems. Would like to continue with this pill.  If you are okay with this Rx will be sent.

## 2014-01-16 ENCOUNTER — Encounter: Payer: Self-pay | Admitting: Gynecology

## 2014-01-21 ENCOUNTER — Emergency Department: Payer: Self-pay | Admitting: Emergency Medicine

## 2014-03-16 ENCOUNTER — Other Ambulatory Visit: Payer: Self-pay | Admitting: Gynecology

## 2014-04-28 ENCOUNTER — Encounter: Payer: Self-pay | Admitting: Gynecology

## 2014-04-28 ENCOUNTER — Ambulatory Visit (INDEPENDENT_AMBULATORY_CARE_PROVIDER_SITE_OTHER): Payer: 59 | Admitting: Gynecology

## 2014-04-28 ENCOUNTER — Other Ambulatory Visit (HOSPITAL_COMMUNITY)
Admission: RE | Admit: 2014-04-28 | Discharge: 2014-04-28 | Disposition: A | Discharge status: Home / Self Care | Payer: 59 | Source: Ambulatory Visit | Attending: Gynecology | Admitting: Gynecology

## 2014-04-28 VITALS — BP 124/80 | Ht 64.0 in | Wt 188.0 lb

## 2014-04-28 DIAGNOSIS — Z113 Encounter for screening for infections with a predominantly sexual mode of transmission: Secondary | ICD-10-CM

## 2014-04-28 DIAGNOSIS — Z1151 Encounter for screening for human papillomavirus (HPV): Secondary | ICD-10-CM | POA: Diagnosis present

## 2014-04-28 DIAGNOSIS — N926 Irregular menstruation, unspecified: Secondary | ICD-10-CM

## 2014-04-28 DIAGNOSIS — Z01419 Encounter for gynecological examination (general) (routine) without abnormal findings: Secondary | ICD-10-CM

## 2014-04-28 LAB — CBC WITH DIFFERENTIAL/PLATELET
Basophils Absolute: 0 10*3/uL (ref 0.0–0.1)
Basophils Relative: 0 % (ref 0–1)
EOS ABS: 0 10*3/uL (ref 0.0–0.7)
Eosinophils Relative: 0 % (ref 0–5)
HEMATOCRIT: 38.6 % (ref 36.0–46.0)
Hemoglobin: 13.2 g/dL (ref 12.0–15.0)
Lymphocytes Relative: 44 % (ref 12–46)
Lymphs Abs: 3.2 10*3/uL (ref 0.7–4.0)
MCH: 30.3 pg (ref 26.0–34.0)
MCHC: 34.2 g/dL (ref 30.0–36.0)
MCV: 88.7 fL (ref 78.0–100.0)
MPV: 9 fL (ref 8.6–12.4)
Monocytes Absolute: 0.3 10*3/uL (ref 0.1–1.0)
Monocytes Relative: 4 % (ref 3–12)
NEUTROS PCT: 52 % (ref 43–77)
Neutro Abs: 3.7 10*3/uL (ref 1.7–7.7)
Platelets: 274 10*3/uL (ref 150–400)
RBC: 4.35 MIL/uL (ref 3.87–5.11)
RDW: 13.5 % (ref 11.5–15.5)
WBC: 7.2 10*3/uL (ref 4.0–10.5)

## 2014-04-28 LAB — COMPREHENSIVE METABOLIC PANEL
ALK PHOS: 55 U/L (ref 39–117)
ALT: 15 U/L (ref 0–35)
AST: 17 U/L (ref 0–37)
Albumin: 3.4 g/dL — ABNORMAL LOW (ref 3.5–5.2)
BUN: 14 mg/dL (ref 6–23)
CO2: 29 mEq/L (ref 19–32)
CREATININE: 0.71 mg/dL (ref 0.50–1.10)
Calcium: 9.1 mg/dL (ref 8.4–10.5)
Chloride: 104 mEq/L (ref 96–112)
Glucose, Bld: 109 mg/dL — ABNORMAL HIGH (ref 70–99)
POTASSIUM: 3.9 meq/L (ref 3.5–5.3)
Sodium: 139 mEq/L (ref 135–145)
Total Bilirubin: 0.6 mg/dL (ref 0.2–1.2)
Total Protein: 7 g/dL (ref 6.0–8.3)

## 2014-04-28 LAB — LIPID PANEL
CHOL/HDL RATIO: 2 ratio
CHOLESTEROL: 129 mg/dL (ref 0–200)
HDL: 64 mg/dL (ref >39)
LDL Cholesterol: 54 mg/dL (ref 0–99)
TRIGLYCERIDES: 56 mg/dL (ref <150)
VLDL: 11 mg/dL (ref 0–40)

## 2014-04-28 LAB — HEPATITIS C ANTIBODY: HCV Ab: NEGATIVE

## 2014-04-28 LAB — HEPATITIS B SURFACE ANTIGEN: Hepatitis B Surface Ag: NEGATIVE

## 2014-04-28 MED ORDER — NORGESTIMATE-ETH ESTRADIOL 0.25-35 MG-MCG PO TABS: 1.0000 | ORAL_TABLET | Freq: Every day | ORAL | Status: DC

## 2014-04-28 NOTE — Patient Instructions (Signed)
Call to Schedule your mammogram  Facilities in [place]: 1)  [facility], [place] Phone: [phone] 2)  [facility]. [address] [place] Phone: [phone] 3)  Dr. Isaiah Blakes at [facility] [address] Phone: [phone]     Mammogram A mammogram is an X-ray test to find changes in a woman's breast. You should get a mammogram if:  You are 42 years of age or older  You have risk factors.   Your doctor recommends that you have one.  BEFORE THE TEST  Do not schedule the test the week before your period, especially if your breasts are sore during this time.  On the day of your mammogram:  Wash your breasts and armpits well. After washing, do not put on any deodorant or talcum powder on until after your test.   Eat and drink as you usually do.   Take your medicines as usual.   If you are diabetic and take insulin, make sure you:   Eat before coming for your test.   Take your insulin as usual.   If you cannot keep your appointment, call before the appointment to cancel. Schedule another appointment.  TEST  You will need to undress from the waist up. You will put on a hospital gown.   Your breast will be put on the mammogram machine, and it will press firmly on your breast with a piece of plastic called a compression paddle. This will make your breast flatter so that the machine can X-ray all parts of your breast.   Both breasts will be X-rayed. Each breast will be X-rayed from above and from the side. An X-ray might need to be taken again if the picture is not good enough.   The mammogram will last about 15 to 30 minutes.  AFTER THE TEST Finding out the results of your test Ask when your test results will be ready. Make sure you get your test results.  Document Released: 05/30/2008 Document Revised: 02/20/2011 Document Reviewed: 05/30/2008 [facility] Patient  Information 2012 [place].  Office will call you to arrange surgery as we discussed.

## 2014-04-28 NOTE — Progress Notes (Signed)
Sherry Sampson Mar 18, 1970 [id number]        44 y.o.  G1P1 for annual exam.  Several issues noted below.  Past medical history,surgical history, problem list, medications, allergies, family history and social history were all reviewed and documented as reviewed in the EPIC chart.  ROS:  Performed with pertinent positives and negatives included in the history, assessment and plan.   Additional significant findings :  none   Exam: Leanne Lovely Vitals:   04/28/14 1139  BP: 124/80  Height: 5\' 4"  (1.626 m)  Weight: 188 lb (85.276 kg)   General appearance:  Normal affect, orientation and appearance. Skin: Grossly normal HEENT: Without gross lesions.  No cervical or supraclavicular adenopathy. Thyroid normal.  Lungs:  Clear without wheezing, rales or rhonchi Cardiac: RR, without RMG Abdominal:  Soft, nontender, without masses, guarding, rebound, organomegaly or hernia Breasts:  Examined lying and sitting without masses, retractions, discharge or axillary adenopathy. Pelvic:  Ext/BUS/vagina normal  Cervix normal.  Pap smear/HPV. GC/Chlamydia  Uterus anteverted, normal size, shape and contour, midline and mobile nontender   Adnexa  Without masses or tenderness    Anus and perineum  Normal   Rectovaginal  Normal sphincter tone without palpated masses or tenderness.    Assessment/Plan:  44 y.o. G1P1 female for annual exam with irregular menses, oral contraceptives.   1. Irregular heavy menses.  Menses continue to be fairly heavy with breakthrough bleeding sometimes bleeding 2 weeks straight. On oral contraceptives. History of same over the past several years. Sonohysterogram with biopsy negative  2 years ago.  Does have several small myomas.  TSH FSH prolactin negative last year. Options for management include changing her pills, Mirena IUD, endometrial ablation, hysterectomy. The need for contraception if she chooses ablation was reviewed. Patient is tired does not want to  change pills that she's done this in the past. Does not want the Mirena IUD. Is thinking about tubal sterilization with endometrial ablation. We'll move towards scheduling this. Absolute and irreversible sterility associated with these procedures reviewed and she is comfortable with this. 2. Pap smear 2012. Pap smear/HPV today. No history of significant abnormal Pap smears previously. 3. Mammography never. Patient will schedule now. SBE monthly reviewed. 4. STD screening. Patient requests STD screening. No known exposure. GC/Chlamydia, hepatitis B, hepatitis C, HIV, RPR ordered. 5. Health maintenance. Baseline CBC comprehensive metabolic panel lipid profile urinalysis ordered. Follow up for surgery is arranged. Continue on the pills for now. Refill 1 year provided.     Anastasio Auerbach MD, 12:14 PM 04/28/2014

## 2014-04-29 LAB — URINALYSIS W MICROSCOPIC + REFLEX CULTURE
CASTS: NONE SEEN
CRYSTALS: NONE SEEN
GLUCOSE, UA: NEGATIVE mg/dL
Hgb urine dipstick: NEGATIVE
Leukocytes, UA: NEGATIVE
Nitrite: NEGATIVE
PH: 6 (ref 5.0–8.0)
Protein, ur: NEGATIVE mg/dL
SPECIFIC GRAVITY, URINE: 1.026 (ref 1.005–1.030)
Urobilinogen, UA: 1 mg/dL (ref 0.0–1.0)

## 2014-04-29 LAB — RPR

## 2014-04-29 LAB — HIV ANTIBODY (ROUTINE TESTING W REFLEX): HIV 1&2 Ab, 4th Generation: NONREACTIVE

## 2014-04-30 LAB — URINE CULTURE
Colony Count: NO GROWTH
Organism ID, Bacteria: NO GROWTH

## 2014-05-02 ENCOUNTER — Other Ambulatory Visit: Payer: Self-pay | Admitting: Gynecology

## 2014-05-02 ENCOUNTER — Other Ambulatory Visit: Payer: Self-pay

## 2014-05-02 ENCOUNTER — Telehealth: Payer: Self-pay

## 2014-05-02 DIAGNOSIS — R7309 Other abnormal glucose: Secondary | ICD-10-CM

## 2014-05-02 LAB — GC/CHLAMYDIA PROBE AMP
CT Probe RNA: NEGATIVE
GC Probe RNA: NEGATIVE

## 2014-05-02 LAB — CYTOLOGY - PAP

## 2014-05-02 NOTE — Telephone Encounter (Signed)
Patient returned my call regarding lab results and I spoke with her regarding surgery order.  Patient said she has just started a new job and is not ready to schedule at this time.  She said she will call me when it is a better time for her.  I will hold her surgery slip and wait for her to call me.

## 2014-05-04 ENCOUNTER — Other Ambulatory Visit: Payer: 59

## 2014-05-04 DIAGNOSIS — R7309 Other abnormal glucose: Secondary | ICD-10-CM

## 2014-05-05 LAB — HEMOGLOBIN A1C
HEMOGLOBIN A1C: 5.2 % (ref <5.7)
MEAN PLASMA GLUCOSE: 103 mg/dL (ref <117)

## 2014-05-05 LAB — GLUCOSE, FASTING: GLUCOSE, FASTING: 90 mg/dL (ref 70–99)

## 2014-05-17 ENCOUNTER — Telehealth: Payer: Self-pay

## 2014-05-17 NOTE — Telephone Encounter (Signed)
I called patient, per Dr. Dorette Grate request, to remind her to schedule her routine mammogram. I provided her with the phone number for [facility].

## 2014-07-18 ENCOUNTER — Telehealth: Payer: Self-pay | Admitting: *Deleted

## 2014-07-18 NOTE — Telephone Encounter (Signed)
PT CALLED AND LEFT MESSAGE IN MY VOICEMAIL STATING SHE IS READY TO SCHEDULE SURGERY, I CALLED PT BACK AND LEFT ON HER VOICEMAIL KATHY IS OUT OF THE OFFICE AND I WILL RELAY TO HER, I SENT KATHY A STAFF MESSAGE REGARDING THIS

## 2014-07-19 ENCOUNTER — Telehealth: Payer: Self-pay

## 2014-07-19 NOTE — Telephone Encounter (Signed)
Patient is ready to schedule surgery. Left message.  I called her back and left some possible dates for her and asked her to call me to discuss.

## 2014-07-21 ENCOUNTER — Telehealth: Payer: Self-pay

## 2014-07-21 NOTE — Telephone Encounter (Signed)
Patient informed. 

## 2014-07-21 NOTE — Telephone Encounter (Signed)
You had recommended Lap Tubal Ligation and Novasure Endo Ablation.  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

## 2014-07-21 NOTE — Telephone Encounter (Signed)
I think one week would do it.

## 2014-07-28 ENCOUNTER — Telehealth: Payer: Self-pay

## 2014-07-28 NOTE — Telephone Encounter (Signed)
I contacted patient to let her know I had the July schedule available now.  She still is not ready to pick a date. She needs to check on work schedule. She said she will call me when she figures it out.

## 2014-07-31 ENCOUNTER — Telehealth: Payer: Self-pay

## 2014-07-31 MED ORDER — MISOPROSTOL 200 MCG PO TABS: ORAL_TABLET | ORAL | Status: DC

## 2014-07-31 NOTE — Telephone Encounter (Signed)
Patient called stating she is ready to schedule her surgery (Lap Tubal, Novasure Endometrial Ablation).  She requested 10/10/14 and I scheduled for that day. She was advised that is our block surgery day and the 9:30am time I scheduled her at is subject to change but if it does I will let her know. Patient was instructed regarding need for Cytotec tab vaginally hs before surgery. Patient was scheduled for pre op consult on 09/29/14.

## 2014-08-04 ENCOUNTER — Telehealth: Payer: Self-pay

## 2014-08-04 NOTE — Telephone Encounter (Signed)
Patient called to say she cannot have her surgery on 10/10/14 as there are issues at work. She will need to postpone to August. I will call her when I get the August schedule.

## 2014-08-15 ENCOUNTER — Telehealth: Payer: Self-pay

## 2014-08-15 NOTE — Telephone Encounter (Signed)
Patient called stating she wanted to have tubal ligation when she has the endometrial ablation done. She wanted to be sure Dr. Loetta Rough knew this and scheduled it. I told her that Dr. Loetta Rough had ordered it to be scheduled with ablation.

## 2014-09-14 ENCOUNTER — Telehealth: Payer: Self-pay

## 2014-09-14 NOTE — Telephone Encounter (Signed)
I called patient to let herknow I now have Sept schedule for surgery.  She had asked for first Tuesday in Sept and I told her I am holding time for her that day and asked her to call me and let me know if she would still like to do that.

## 2014-09-22 ENCOUNTER — Telehealth: Payer: Self-pay

## 2014-09-22 NOTE — Telephone Encounter (Signed)
I called patient and let her know that I have Sept book. She called me back and said Sept 20 will work fine. I called her back and left message that I am holding time for 12/05/14 and will schedule her when we talk and for her to call me back when she can.

## 2014-09-29 ENCOUNTER — Institutional Professional Consult (permissible substitution): Payer: 59 | Admitting: Gynecology

## 2014-10-10 ENCOUNTER — Telehealth: Payer: Self-pay

## 2014-10-10 ENCOUNTER — Ambulatory Visit: Admit: 2014-10-10 | Payer: Self-pay | Admitting: Gynecology

## 2014-10-10 SURGERY — LIGATION, FALLOPIAN TUBE, LAPAROSCOPIC
Anesthesia: General

## 2014-10-10 NOTE — Telephone Encounter (Signed)
I left message for patient that I have scheduled her surgery for 9/20 at 8:45am. I asked her to give me a call to the office to schedule her pre op consult.

## 2014-11-30 ENCOUNTER — Ambulatory Visit (INDEPENDENT_AMBULATORY_CARE_PROVIDER_SITE_OTHER): Payer: 59 | Admitting: Gynecology

## 2014-11-30 ENCOUNTER — Encounter: Payer: Self-pay | Admitting: Gynecology

## 2014-11-30 ENCOUNTER — Encounter (HOSPITAL_COMMUNITY)
Admission: RE | Admit: 2014-11-30 | Discharge: 2014-11-30 | Disposition: A | Discharge status: Home / Self Care | Payer: 59 | Source: Ambulatory Visit | Attending: Gynecology | Admitting: Gynecology

## 2014-11-30 ENCOUNTER — Encounter (HOSPITAL_COMMUNITY): Payer: Self-pay

## 2014-11-30 VITALS — BP 122/78

## 2014-11-30 DIAGNOSIS — Z01818 Encounter for other preprocedural examination: Secondary | ICD-10-CM | POA: Insufficient documentation

## 2014-11-30 DIAGNOSIS — N926 Irregular menstruation, unspecified: Secondary | ICD-10-CM | POA: Diagnosis not present

## 2014-11-30 DIAGNOSIS — N921 Excessive and frequent menstruation with irregular cycle: Secondary | ICD-10-CM

## 2014-11-30 DIAGNOSIS — N920 Excessive and frequent menstruation with regular cycle: Secondary | ICD-10-CM | POA: Diagnosis not present

## 2014-11-30 LAB — CBC
HCT: 38.8 % (ref 36.0–46.0)
Hemoglobin: 13.1 g/dL (ref 12.0–15.0)
MCH: 30.3 pg (ref 26.0–34.0)
MCHC: 33.8 g/dL (ref 30.0–36.0)
MCV: 89.6 fL (ref 78.0–100.0)
PLATELETS: 268 10*3/uL (ref 150–400)
RBC: 4.33 MIL/uL (ref 3.87–5.11)
RDW: 14.2 % (ref 11.5–15.5)
WBC: 6.4 10*3/uL (ref 4.0–10.5)

## 2014-11-30 NOTE — Patient Instructions (Addendum)
   Your procedure is scheduled on: sept 20 at 1115am  Enter through the Main Entrance of [facility] at: 945am Pick up the phone at the desk and dial [phone] and inform us of your arrival.  Please call this number if you have any problems the morning of surgery: [phone]  Remember: DO NOT EAT OR DRINK AFTER MIDNIGHT SEPT 19   Do not wear jewelry, make-up, or FINGER nail polish No metal in your hair or on your body. Do not wear lotions, powders, perfumes.  You may wear deodorant.  Do not bring valuables to the hospital.     Patients discharged on the day of surgery will not be allowed to drive home.

## 2014-11-30 NOTE — Patient Instructions (Signed)
Followup for surgery as scheduled. 

## 2014-11-30 NOTE — H&P (Addendum)
Sherry Sampson 07-Oct-1970 [id number]   History and Physical  Chief complaint: heavy irregular menses, desires permanent sterilization  History of present illness: 44 y.o. G1P1 with many year history of heavy irregular menses. Has tried a variety of oral contraceptives without success controlling her bleeding. Sonohysterogram several years ago negative with biopsy secretory endometrium.  FSH TSH prolactin normal last year. Options for management to include continued attempts at hormonal manipulation, Mirena IUD, endometrial ablation, hysterectomy were all reviewed and the patient elects for endometrial ablation. Patient also wants to proceed with tubal sterilization and we will plan for laparoscopic tubal sterilization.  Past medical history,surgical history, medications, allergies, family history and social history were all reviewed and documented in the EPIC chart.  ROS:  Was performed and pertinent positives and negatives are included in the history of present illness.  Exam:  Kim assistant 11/30/2014 General: well developed, well nourished female, no acute distress HEENT: normal  Lungs: clear to auscultation without wheezing, rales or rhonchi  Cardiac: regular rate without rubs, murmurs or gallops  Abdomen: soft, nontender without masses, guarding, rebound, organomegaly  Pelvic: external bus vagina: normal   Cervix: grossly normal  Uterus: normal size, midline and mobile, nontender  Adnexa: without masses or tenderness    Assessment/Plan:  44 y.o. G1P1 with heavy irregular menses and negative sonohysterogram and biopsy as well as negative hormone studies. Options reviewed as above and patient elects for endometrial ablation and is admitted for NovaSure endometrial ablation. It has been several years since her negative ultrasound/biopsy although her symptom pattern has not changed. Options to repeat the studies preoperatively or proceed with a hysteroscopy D&C right before the endometrial  ablation to clear the cavity was discussed. She understands that if we do the hysteroscopy D&C before the procedure and significant pathology returns on the final specimen after the ablation that we may not be able to further evaluate the cavity due to the ablation and have to proceed with hysterectomy and she understands and agrees with that plan. She understands that she should never pursue pregnancy following the ablation and that is why she wants to proceed with tubal sterilization at this point. She understands that tubal sterilization is absolute irreversible sterility although there are failures following sterilization and she understands this. She also understands that there are failures as far as the endometrial ablation and that her heavy periods may continue or worsen following the procedure. We will tentatively plan on Falope ring sterilization with cautery back up.  The expected intraoperative and postoperative courses as well as the recovery period was reviewed. The risks of infection, prolonged antibiotics, reoperation for abscess or hematoma formation was discussed. The risks of hemorrhage necessitating transfusion and the risks of transfusion reaction, hepatitis, HIV, mad cow disease and other unknown entities was also discussed. Incisional complications to include opening and draining of incisions and closure by secondary intention, dehiscence and long-term issues of keloid/cosmetics and hernia formation were reviewed. The risk of inadvertent injury to internal organs including vagina, cervix, uterus with perforation, bowel, bladder, ureters, vessels, nerves either immediately recognized or delay recognized necessitating major exploratory reparative surgeries and future reparative surgeries including bowel resection, ostomy formation, bladder repair, ureteral damage repair was discussed with her. The patient's questions were answered to her satisfaction and she is ready to proceed with  surgery.  Addendum 12/04/2014: Reviewed SGO recommendations to consider bilateral salpingectomy in patients who are undergoing tubal sterilization or hysterectomy as a risk reductive surgery for "ovarian cancer". Patient wants to go  ahead and do so as per my 12/04/2014 telephone note in [place].   Anastasio Auerbach MD, 9:27 AM 11/30/2014

## 2014-11-30 NOTE — Progress Notes (Signed)
Sherry Sampson 1970-07-26 [id number]   Preoperative consult  Chief complaint: heavy irregular menses, desires permanent sterilization  History of present illness: 44 y.o. G1P1 with many year history of heavy irregular menses. Has tried a variety of oral contraceptives without success controlling her bleeding. Sonohysterogram several years ago negative with biopsy secretory endometrium.  FSH TSH prolactin normal last year. Options for management to include continued attempts at hormonal manipulation, Mirena IUD, endometrial ablation, hysterectomy were all reviewed and the patient elects for endometrial ablation. Patient also wants to proceed with tubal sterilization and we will plan for laparoscopic tubal sterilization.  Past medical history,surgical history, medications, allergies, family history and social history were all reviewed and documented in the EPIC chart.  ROS:  Was performed and pertinent positives and negatives are included in the history of present illness.  Exam:  Kim assistant General: well developed, well nourished female, no acute distress HEENT: normal  Lungs: clear to auscultation without wheezing, rales or rhonchi  Cardiac: regular rate without rubs, murmurs or gallops  Abdomen: soft, nontender without masses, guarding, rebound, organomegaly  Pelvic: external bus vagina: normal   Cervix: grossly normal  Uterus: normal size, midline and mobile, nontender  Adnexa: without masses or tenderness    Assessment/Plan:  44 y.o. G1P1 with heavy irregular menses and negative sonohysterogram and biopsy as well as negative hormone studies. Options reviewed as above and patient elects for endometrial ablation and is admitted for NovaSure endometrial ablation. It has been several years since her negative ultrasound/biopsy although her symptom pattern has not changed. Options to repeat the studies preoperatively or proceed with a hysteroscopy D&C right before the endometrial ablation to  clear the cavity was discussed. She understands that if we do the hysteroscopy D&C before the procedure and significant pathology returns on the final specimen after the ablation that we may not be able to further evaluate the cavity due to the ablation and have to proceed with hysterectomy and she understands and agrees with that plan. She understands that she should never pursue pregnancy following the ablation and that is why she wants to proceed with tubal sterilization at this point. She understands that tubal sterilization is absolute irreversible sterility although there are failures following sterilization and she understands this. She also understands that there are failures as far as the endometrial ablation and that her heavy periods may continue or worsen following the procedure. We will tentatively plan on Falope ring sterilization with cautery back up.  The expected intraoperative and postoperative courses as well as the recovery period was reviewed. The risks of infection, prolonged antibiotics, reoperation for abscess or hematoma formation was discussed. The risks of hemorrhage necessitating transfusion and the risks of transfusion reaction, hepatitis, HIV, mad cow disease and other unknown entities was also discussed. Incisional complications to include opening and draining of incisions and closure by secondary intention, dehiscence and long-term issues of keloid/cosmetics and hernia formation were reviewed. The risk of inadvertent injury to internal organs including vagina, cervix, uterus with perforation, bowel, bladder, ureters, vessels, nerves either immediately recognized or delay recognized necessitating major exploratory reparative surgeries and future reparative surgeries including bowel resection, ostomy formation, bladder repair, ureteral damage repair was discussed with her. The patient's questions were answered to her satisfaction and she is ready to proceed with  surgery.     Anastasio Auerbach MD, 9:19 AM 11/30/2014

## 2014-12-04 ENCOUNTER — Telehealth: Payer: Self-pay | Admitting: Gynecology

## 2014-12-04 ENCOUNTER — Telehealth: Payer: Self-pay

## 2014-12-04 MED ORDER — DEXTROSE 5 % IV SOLN
2.0000 g | INTRAVENOUS | Status: AC
  Administered 2014-12-05: 2 g via INTRAVENOUS
  Filled 2014-12-04: qty 2

## 2014-12-04 NOTE — Telephone Encounter (Signed)
Called patient in preparation for her planned surgery tomorrow of laparoscopic BTL with NovaSure endometrial ablation. I reviewed the SGO recommendations to consider bilateral salpingectomy in patients undergoing tubal sterilization or hysterectomy for risk reductive surgery for "ovarian cancer". I discussed with her that the surgery may be a little bit longer and that this does not allow for any consideration for tubal reconstruction in the future.  It is considered permanent sterilization as is the BTL. She does want me to go ahead and attempt this. She does understand the from a technical reason it would be more involved and place her at a higher surgical risk that we will not do this but proceed with the tubal sterilization alone. Patient is comfortable with this plan.

## 2014-12-04 NOTE — Telephone Encounter (Signed)
I spoke with Sonya on 12/01/14. Call Ref [id number] to get benefits for Lap Salpingectomy.  She said the patient's deductible does not apply and physician charges are paid at 100%. No Prior Josem Kaufmann is required for [phone] or [phone] .  Dr. Loetta Rough informed.  On 12/01/14 I spoke with [facility] Call Ref [phone] at the ins co because Dr. Loetta Rough decided to add a D&C, Hysteroscopy. Pre-certification was required and she set it up for me adding it to previous auth #[id number] that is for [phone] and [phone].  She told me to call on Monday (today) and check to make sure all approved.   I called today 12/04/14 and spoke with Webb Silversmith. Call ref# [id number] and confirmed that CPT 58558 (D&C, Hyst) has been authorized with [phone] and [phone].

## 2014-12-05 ENCOUNTER — Encounter (HOSPITAL_COMMUNITY)
Admission: RE | Disposition: A | Discharge status: Home / Self Care | Payer: Self-pay | Source: Ambulatory Visit | Attending: Gynecology

## 2014-12-05 ENCOUNTER — Ambulatory Visit (HOSPITAL_COMMUNITY)
Admission: RE | Admit: 2014-12-05 | Discharge: 2014-12-05 | Disposition: A | Discharge status: Home / Self Care | Payer: 59 | Source: Ambulatory Visit | Attending: Gynecology | Admitting: Gynecology

## 2014-12-05 ENCOUNTER — Ambulatory Visit (HOSPITAL_COMMUNITY): Payer: 59 | Admitting: Anesthesiology

## 2014-12-05 DIAGNOSIS — D252 Subserosal leiomyoma of uterus: Secondary | ICD-10-CM | POA: Insufficient documentation

## 2014-12-05 DIAGNOSIS — N803 Endometriosis of pelvic peritoneum: Secondary | ICD-10-CM | POA: Insufficient documentation

## 2014-12-05 DIAGNOSIS — N926 Irregular menstruation, unspecified: Secondary | ICD-10-CM | POA: Diagnosis present

## 2014-12-05 DIAGNOSIS — N800 Endometriosis of uterus: Secondary | ICD-10-CM | POA: Diagnosis not present

## 2014-12-05 DIAGNOSIS — N920 Excessive and frequent menstruation with regular cycle: Secondary | ICD-10-CM | POA: Diagnosis not present

## 2014-12-05 DIAGNOSIS — N921 Excessive and frequent menstruation with irregular cycle: Secondary | ICD-10-CM | POA: Diagnosis not present

## 2014-12-05 DIAGNOSIS — Z302 Encounter for sterilization: Secondary | ICD-10-CM

## 2014-12-05 DIAGNOSIS — D251 Intramural leiomyoma of uterus: Secondary | ICD-10-CM | POA: Diagnosis not present

## 2014-12-05 HISTORY — PX: LAPAROSCOPIC BILATERAL SALPINGECTOMY: SHX5889

## 2014-12-05 HISTORY — PX: DILITATION & CURRETTAGE/HYSTROSCOPY WITH NOVASURE ABLATION: SHX5568

## 2014-12-05 LAB — HCG, SERUM, QUALITATIVE: Preg, Serum: NEGATIVE

## 2014-12-05 SURGERY — SALPINGECTOMY, BILATERAL, LAPAROSCOPIC
Anesthesia: General | Laterality: Bilateral

## 2014-12-05 SURGERY — Surgical Case
Anesthesia: *Unknown

## 2014-12-05 MED ORDER — LIDOCAINE HCL 1 % IJ SOLN
INTRAMUSCULAR | Status: DC | PRN
  Administered 2014-12-05: 9 mL

## 2014-12-05 MED ORDER — PROPOFOL 10 MG/ML IV BOLUS
INTRAVENOUS | Status: AC
  Filled 2014-12-05: qty 20

## 2014-12-05 MED ORDER — OXYCODONE-ACETAMINOPHEN 5-325 MG PO TABS
ORAL_TABLET | ORAL | Status: AC
  Filled 2014-12-05: qty 1

## 2014-12-05 MED ORDER — FENTANYL CITRATE (PF) 100 MCG/2ML IJ SOLN
INTRAMUSCULAR | Status: DC | PRN
  Administered 2014-12-05 (×2): 50 ug via INTRAVENOUS
  Administered 2014-12-05: 100 ug via INTRAVENOUS
  Administered 2014-12-05: 50 ug via INTRAVENOUS

## 2014-12-05 MED ORDER — FENTANYL CITRATE (PF) 250 MCG/5ML IJ SOLN
INTRAMUSCULAR | Status: AC
  Filled 2014-12-05: qty 25

## 2014-12-05 MED ORDER — LIDOCAINE HCL 1 % IJ SOLN
INTRAMUSCULAR | Status: AC
  Filled 2014-12-05: qty 20

## 2014-12-05 MED ORDER — FENTANYL CITRATE (PF) 100 MCG/2ML IJ SOLN
INTRAMUSCULAR | Status: AC
  Administered 2014-12-05: 50 ug via INTRAVENOUS
  Filled 2014-12-05: qty 2

## 2014-12-05 MED ORDER — FENTANYL CITRATE (PF) 100 MCG/2ML IJ SOLN
25.0000 ug | INTRAMUSCULAR | Status: DC | PRN
  Administered 2014-12-05 (×2): 50 ug via INTRAVENOUS

## 2014-12-05 MED ORDER — PROPOFOL 10 MG/ML IV BOLUS
INTRAVENOUS | Status: DC | PRN
  Administered 2014-12-05: 170 mg via INTRAVENOUS
  Administered 2014-12-05: 30 mg via INTRAVENOUS

## 2014-12-05 MED ORDER — LACTATED RINGERS IR SOLN
Status: DC | PRN
  Administered 2014-12-05 (×2): 3000 mL

## 2014-12-05 MED ORDER — LABETALOL HCL 5 MG/ML IV SOLN
INTRAVENOUS | Status: DC | PRN
  Administered 2014-12-05: 5 mg via INTRAVENOUS

## 2014-12-05 MED ORDER — HEPARIN SODIUM (PORCINE) 5000 UNIT/ML IJ SOLN
INTRAMUSCULAR | Status: AC
  Filled 2014-12-05: qty 1

## 2014-12-05 MED ORDER — LIDOCAINE HCL (CARDIAC) 20 MG/ML IV SOLN
INTRAVENOUS | Status: DC | PRN
  Administered 2014-12-05: 100 mg via INTRAVENOUS

## 2014-12-05 MED ORDER — LIDOCAINE HCL (CARDIAC) 20 MG/ML IV SOLN
INTRAVENOUS | Status: AC
  Filled 2014-12-05: qty 5

## 2014-12-05 MED ORDER — MIDAZOLAM HCL 2 MG/2ML IJ SOLN
INTRAMUSCULAR | Status: DC | PRN
  Administered 2014-12-05: 2 mg via INTRAVENOUS

## 2014-12-05 MED ORDER — OXYCODONE-ACETAMINOPHEN 5-325 MG PO TABS
1.0000 | ORAL_TABLET | ORAL | Status: DC | PRN
  Administered 2014-12-05: 1 via ORAL

## 2014-12-05 MED ORDER — ONDANSETRON HCL 4 MG/2ML IJ SOLN: 4.0000 mg | Freq: Once | INTRAMUSCULAR | Status: DC | PRN

## 2014-12-05 MED ORDER — GLYCOPYRROLATE 0.2 MG/ML IJ SOLN
INTRAMUSCULAR | Status: DC | PRN
  Administered 2014-12-05: 0.6 mg via INTRAVENOUS

## 2014-12-05 MED ORDER — KETOROLAC TROMETHAMINE 30 MG/ML IJ SOLN
INTRAMUSCULAR | Status: DC | PRN
  Administered 2014-12-05: 30 mg via INTRAVENOUS

## 2014-12-05 MED ORDER — SCOPOLAMINE 1 MG/3DAYS TD PT72
1.0000 | MEDICATED_PATCH | Freq: Once | TRANSDERMAL | Status: DC
  Administered 2014-12-05: 1.5 mg via TRANSDERMAL

## 2014-12-05 MED ORDER — BUPIVACAINE HCL (PF) 0.25 % IJ SOLN
INTRAMUSCULAR | Status: DC | PRN
  Administered 2014-12-05: 10 mL

## 2014-12-05 MED ORDER — MIDAZOLAM HCL 2 MG/2ML IJ SOLN
INTRAMUSCULAR | Status: AC
  Filled 2014-12-05: qty 4

## 2014-12-05 MED ORDER — DEXAMETHASONE SODIUM PHOSPHATE 10 MG/ML IJ SOLN
INTRAMUSCULAR | Status: DC | PRN
  Administered 2014-12-05: 4 mg via INTRAVENOUS

## 2014-12-05 MED ORDER — LACTATED RINGERS IV SOLN
INTRAVENOUS | Status: DC
  Administered 2014-12-05 (×3): via INTRAVENOUS

## 2014-12-05 MED ORDER — ONDANSETRON HCL 4 MG/2ML IJ SOLN
INTRAMUSCULAR | Status: DC | PRN
  Administered 2014-12-05: 4 mg via INTRAVENOUS

## 2014-12-05 MED ORDER — SCOPOLAMINE 1 MG/3DAYS TD PT72
MEDICATED_PATCH | TRANSDERMAL | Status: AC
  Administered 2014-12-05: 1.5 mg via TRANSDERMAL
  Filled 2014-12-05: qty 1

## 2014-12-05 MED ORDER — PHENYLEPHRINE HCL 10 MG/ML IJ SOLN
INTRAMUSCULAR | Status: DC | PRN
  Administered 2014-12-05 (×2): 80 ug via INTRAVENOUS

## 2014-12-05 MED ORDER — BUPIVACAINE HCL (PF) 0.25 % IJ SOLN
INTRAMUSCULAR | Status: AC
  Filled 2014-12-05: qty 30

## 2014-12-05 MED ORDER — ONDANSETRON HCL 4 MG/2ML IJ SOLN
INTRAMUSCULAR | Status: AC
  Filled 2014-12-05: qty 2

## 2014-12-05 MED ORDER — NEOSTIGMINE METHYLSULFATE 10 MG/10ML IV SOLN
INTRAVENOUS | Status: DC | PRN
  Administered 2014-12-05: 3 mg via INTRAVENOUS

## 2014-12-05 MED ORDER — ROCURONIUM BROMIDE 100 MG/10ML IV SOLN
INTRAVENOUS | Status: DC | PRN
  Administered 2014-12-05: 10 mg via INTRAVENOUS
  Administered 2014-12-05: 5 mg via INTRAVENOUS
  Administered 2014-12-05: 25 mg via INTRAVENOUS

## 2014-12-05 MED ORDER — OXYCODONE-ACETAMINOPHEN 5-325 MG PO TABS: 1.0000 | ORAL_TABLET | ORAL | Status: DC | PRN

## 2014-12-05 SURGICAL SUPPLY — 34 items
ABLATOR ENDOMETRIAL BIPOLAR (ABLATOR) ×3 IMPLANT
BARRIER ADHS 3X4 INTERCEED (GAUZE/BANDAGES/DRESSINGS) IMPLANT
BLADE SURG 15 STRL LF C SS BP (BLADE) ×1 IMPLANT
BLADE SURG 15 STRL SS (BLADE) ×2
CABLE HIGH FREQUENCY MONO STRZ (ELECTRODE) IMPLANT
CATH ROBINSON RED A/P 16FR (CATHETERS) ×3 IMPLANT
CLOTH BEACON ORANGE TIMEOUT ST (SAFETY) ×3 IMPLANT
CONTAINER PREFILL 10% NBF 60ML (FORM) ×6 IMPLANT
DRSG COVADERM PLUS 2X2 (GAUZE/BANDAGES/DRESSINGS) ×3 IMPLANT
DRSG OPSITE POSTOP 3X4 (GAUZE/BANDAGES/DRESSINGS) ×3 IMPLANT
GLOVE BIO SURGEON STRL SZ7.5 (GLOVE) ×3 IMPLANT
GLOVE BIOGEL PI IND STRL 8 (GLOVE) ×1 IMPLANT
GLOVE BIOGEL PI INDICATOR 8 (GLOVE) ×2
GOWN STRL REUS W/TWL LRG LVL3 (GOWN DISPOSABLE) ×6 IMPLANT
LIQUID BAND (GAUZE/BANDAGES/DRESSINGS) ×3 IMPLANT
NS IRRIG 1000ML POUR BTL (IV SOLUTION) ×3 IMPLANT
PACK LAPAROSCOPY BASIN (CUSTOM PROCEDURE TRAY) ×3 IMPLANT
PACK VAGINAL MINOR WOMEN LF (CUSTOM PROCEDURE TRAY) ×3 IMPLANT
PAD OB MATERNITY 4.3X12.25 (PERSONAL CARE ITEMS) ×3 IMPLANT
PAD POSITIONING PINK XL (MISCELLANEOUS) ×3 IMPLANT
POUCH SPECIMEN RETRIEVAL 10MM (ENDOMECHANICALS) IMPLANT
PROTECTOR NERVE ULNAR (MISCELLANEOUS) ×3 IMPLANT
SET IRRIG TUBING LAPAROSCOPIC (IRRIGATION / IRRIGATOR) ×3 IMPLANT
SHEARS HARMONIC ACE PLUS 36CM (ENDOMECHANICALS) ×3 IMPLANT
SLEEVE XCEL OPT CAN 5 100 (ENDOMECHANICALS) ×3 IMPLANT
SUT PLAIN 4 0 FS 2 27 (SUTURE) ×3 IMPLANT
SUT VICRYL 0 UR6 27IN ABS (SUTURE) ×3 IMPLANT
TOWEL OR 17X24 6PK STRL BLUE (TOWEL DISPOSABLE) ×6 IMPLANT
TROCAR XCEL NON-BLD 11X100MML (ENDOMECHANICALS) ×3 IMPLANT
TROCAR XCEL NON-BLD 5MMX100MML (ENDOMECHANICALS) ×3 IMPLANT
TUBING AQUILEX INFLOW (TUBING) ×3 IMPLANT
TUBING AQUILEX OUTFLOW (TUBING) ×3 IMPLANT
WARMER LAPAROSCOPE (MISCELLANEOUS) ×3 IMPLANT
WATER STERILE IRR 1000ML POUR (IV SOLUTION) ×3 IMPLANT

## 2014-12-05 NOTE — Anesthesia Procedure Notes (Signed)
Date/Time: 12/05/2014 11:44 AM Performed by: Casimer Lanius A

## 2014-12-05 NOTE — Anesthesia Postprocedure Evaluation (Signed)
  Anesthesia Post-op Note  Patient: Sherry Sampson  Procedure(s) Performed: Procedure(s) (LRB): LAPAROSCOPIC BILATERAL SALPINGECTOMY AND FULGURATION OF ENDOMETRIOSIS (Bilateral) DILATATION & CURETTAGE/HYSTEROSCOPY WITH NOVASURE ABLATION (Bilateral)  Patient Location: PACU  Anesthesia Type: General  Level of Consciousness: awake and alert   Airway and Oxygen Therapy: Patient Spontanous Breathing  Post-op Pain: mild  Post-op Assessment: Post-op Vital signs reviewed, Patient's Cardiovascular Status Stable, Respiratory Function Stable, Patent Airway and No signs of Nausea or vomiting  Last Vitals:  Filed Vitals:   12/05/14 1545  BP: 154/99  Pulse: 53  Temp:   Resp: 16    Post-op Vital Signs: stable   Complications: No apparent anesthesia complications

## 2014-12-05 NOTE — H&P (Signed)
  The patient was examined.  I reviewed the proposed surgery and consent form with the patient.  I again reviewed with her the issue of salpingectomy versus standard tubal sterilization and the SGO recommendations as I have previously on the telephone.  The dictated history and physical is current and accurate and all questions were answered. The patient is ready to proceed with surgery and has a realistic understanding and expectation for the outcome.   Anastasio Auerbach MD, 11:25 AM 12/05/2014

## 2014-12-05 NOTE — Anesthesia Preprocedure Evaluation (Addendum)
Anesthesia Evaluation  Patient identified by MRN, date of birth, ID band Patient awake    Reviewed: Allergy & Precautions, NPO status , Patient's Chart, lab work & pertinent test results  History of Anesthesia Complications Negative for: history of anesthetic complications  Airway Mallampati: II  TM Distance: >3 FB Neck ROM: Full    Dental no notable dental hx. (+) Dental Advisory Given   Pulmonary neg pulmonary ROS,    Pulmonary exam normal breath sounds clear to auscultation       Cardiovascular hypertension, Normal cardiovascular exam Rhythm:Regular Rate:Normal     Neuro/Psych negative neurological ROS  negative psych ROS   GI/Hepatic negative GI ROS, Neg liver ROS,   Endo/Other  obesity  Renal/GU negative Renal ROS  negative genitourinary   Musculoskeletal negative musculoskeletal ROS (+)   Abdominal   Peds negative pediatric ROS (+)  Hematology negative hematology ROS (+)   Anesthesia Other Findings   Reproductive/Obstetrics negative OB ROS                            Anesthesia Physical Anesthesia Plan  ASA: II  Anesthesia Plan: General   Post-op Pain Management:    Induction: Intravenous  Airway Management Planned: Oral ETT  Additional Equipment:   Intra-op Plan:   Post-operative Plan: Extubation in OR  Informed Consent: I have reviewed the patients History and Physical, chart, labs and discussed the procedure including the risks, benefits and alternatives for the proposed anesthesia with the patient or authorized representative who has indicated his/her understanding and acceptance.   Dental advisory given  Plan Discussed with: CRNA  Anesthesia Plan Comments:         Anesthesia Quick Evaluation

## 2014-12-05 NOTE — Op Note (Signed)
Sherry Sampson Dec 01, 1970 [id number]   Post Operative Note   Date of surgery:  12/05/2014  Pre Op Dx:  Menorrhagia with irregular cycle, desires permanent sterilization  Post Op Dx:  Menorrhagia with irregular cycle, desires permanent sterilization, leiomyoma, endometriosis  Procedure:  Laparoscopic bilateral salpingectomies, fulguration endometriosis, diagnostic hysteroscopy D&C, NovaSure endometrial ablation  Surgeon:  Anastasio Auerbach  Assistant:  Scrub technician  Anesthesia:  General  EBL:  minimal  Distended media discrepancy:  35 cc saline  Complications:  None  Specimen:  #1 right and left fallopian tubes #2 endometrial curettage to pathology  Findings: EUA:  External BUS vagina normal. Uterus grossly normal size midline mobile. Adnexa without masses   Hysteroscopy: Adequate noting fundus, anterior/posterior endometrial surfaces, lower uterine segment, endocervical canal, right and left tubal ostia all visualized. No abnormalities noted.   Operative: Anterior cul-de-sac normal. Posterior cul-de-sac with several small classic powder burn endometriotic implants, superficial, fulgurated. Uterus grossly normal size and shape with several small several millimeter subserosal leiomyoma. Right and left fallopian tubes normal length, caliber and fimbriated ends. Right and left ovaries grossly normal free and mobile. Upper abdominal exam shows liver smooth with no abnormalities. Gallbladder grossly normal. Appendix visualized and grossly normal.   NovaSure settings: Sounding length 8.5 cm. Cervical length 3.5 cm. Cavity length 5 cm. Cavity width 2.9. Power 80. Time 78 seconds  Procedure:  The patient was taken to the operating room, placed in the low dorsal lithotomy position, underwent general anesthesia, received an abdominal preparation with DuraPrep by nursing personnel and a vaginal/perineal preparation using Betadine solution by the physician with an in and out catheterization  performed. The timeout was performed by the surgical team. The patient was draped in the usual fashion.  A vertical infraumbilical incision was made and using the 10 mm direct entry type trocar the abdomen was directly entered under direct visualization without difficulty and subsequently insufflated. Right and left 5 mm suprapubic ports were placed after transillumination for the vessels under direct visualization without difficulty. Examination of the abdomen was performed with findings noted above. The left fallopian tube was identified and elevated, the ureter identified away from the surgical site and using the harmonic scalpel with care to avoid the infundibulopelvic ligament and vessels the fallopian tube was excised in its entirety along the mesosalpinx to the level of it's insertion with the uterus. A similar procedure was carried out on the other side. Both fallopian tube segments were then retrieved through the infraumbilical port and sent to pathology. Reinspection of the ovaries showed good vascularization and hemostasis along the mesosalpinx surgical line. The several small endometriotic implants in the posterior cul-de-sac were superficially bipolar fulgurated.  The 5 mm ports were removed under direct visualization showing adequate hemostasis and no evidence of hernia formation and the infraumbilical port was backed out under direct visualization showing adequate hemostasis and no evidence of hernia formation. The infraumbilical port was closed using 0 Vicryl in an interrupted subcutaneous fascial stitch and the skin reapproximated using 4-0 plain suture in a running subcuticular stitch. All skin incisions were injected with 0.25% Marcaine and the 5 mm ports were closed using LiquiBand skin adhesive as was the infraumbilical incision. Sterile dressings were applied and attention was then turned to the vaginal portion of the procedure. The patient's cervix was visualized with a speculum, anterior lip  grasped with a single-tooth tenaculum and a paracervical block using 1% lidocaine, 10 cc total was placed. The cervical length and uterine length were then determined  as noted above and the diagnostic hysteroscope was then introduced and hysteroscopy was performed which was noted to be normal. A gentle sharp curettage was performed and the specimen was sent to pathology. The cervix was gently dilated to admit the NovaSure ablation instrument and the instrument was placed according to manufacturer's recommendations and the cavity width was measured as above. After passing the CO2 cavity integrity test the ablation was performed without difficulty and the instrument was subsequently removed. The tenaculum was removed and hemostasis was visualized at both the tenaculum and external os sites. The patient received intraoperative Toradol, was awakened without difficulty and was taken to the recovery room in good condition having tolerated procedure well.     Anastasio Auerbach MD, 1:00 PM 12/05/2014

## 2014-12-05 NOTE — Transfer of Care (Signed)
Immediate Anesthesia Transfer of Care Note  Patient: Sherry Sampson  Procedure(s) Performed: Procedure(s): LAPAROSCOPIC BILATERAL SALPINGECTOMY AND FULGURATION OF ENDOMETRIOSIS (Bilateral) DILATATION & CURETTAGE/HYSTEROSCOPY WITH NOVASURE ABLATION (Bilateral)  Patient Location: PACU  Anesthesia Type:General  Level of Consciousness: awake  Airway & Oxygen Therapy: Patient Spontanous Breathing  Post-op Assessment: Report given to PACU RN  Post vital signs: stable  Filed Vitals:   12/05/14 1105  BP: 186/100  Pulse:   Temp:     Complications: No apparent anesthesia complications

## 2014-12-05 NOTE — Discharge Instructions (Signed)
Postoperative Instructions Laparoscopy ° °Dr. Marka Treloar and the nursing staff have discussed postoperative instructions with you.  If you have any questions please ask them before you leave the hospital, or call Dr Breindel Collier’s office at [phone].   ° °We would like to emphasize the following instructions: ° ° °? Call the office to make your follow-up appointment as recommended by Dr Louisiana Searles (usually 1-2 weeks). ° °? You were given a prescription, or one was ordered for you at the pharmacy you designated.  Get that prescription filled and take the medication according to instructions. ° °? You may eat a regular diet, but slowly until you start having bowel movements. ° °? Drink plenty of water daily. ° °? Nothing in the vagina (intercourse, douching, objects of any kind) for 2 weeks.  When reinitiating intercourse, if it is uncomfortable, stop and make an appointment with Dr Miniya Miguez to be evaluated. ° °? No driving for several days until the anesthesia has worn off and you are not having significant pain.  Car rides (short) are ok, as long as you are not having significant pain, but no traveling out of town until your postoperative appointment. ° °? You may shower, but no baths for two weeks.  Walking up and down stairs is ok.  No heavy lifting, prolonged standing, repeated bending or any “working out” until your first  postoperative appointment. ° °? Rest frequently, listen to your body and do not push yourself and overdo it. ° °? Call if: ° °o Your pain medication does not seem strong enough. °o Worsening pain or abdominal bloating °o Persistent nausea or vomiting °o Difficulty with urination or bowel movements. °o Temperature of 101 degrees or higher. °o Heavy vaginal bleeding.  If your period is due, you may use tampons.   °o Incisions become red, tender or begin to drain. °o You have any questions or concerns °

## 2014-12-06 ENCOUNTER — Encounter (HOSPITAL_COMMUNITY): Payer: Self-pay | Admitting: Gynecology

## 2014-12-13 ENCOUNTER — Telehealth: Payer: Self-pay

## 2014-12-13 NOTE — Telephone Encounter (Signed)
Patient informed. Letter provided. Copy is in EPIC.

## 2014-12-13 NOTE — Telephone Encounter (Addendum)
At patient's request a letter for light duty for her job was provided. She called back after talking to her employer to say that light duty is not allowed unless you are on Workmen's Comp.  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  She works this weekend and then is off Monday and Tuesday. She asked if you would just write her out of work until next Weds and she will return to work on 12/20/14 without restrictions. That would just allow her not to work the weekend and have another week to recover. Ok?

## 2014-12-13 NOTE — Telephone Encounter (Signed)
Had Lap Bilat Salpingectomy, D&C, Hysteroscopy and Novasure ablation on 12/05/14.  Her post op appt is scheduled for 12/25/14.  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 She first implied days out of work but changed her mind while talking and said light duty for a few more days and she can get in some rough situations.  Ok to write her a note for light duty for another week?  (Are you okay with her returning to work without post op visit first?)

## 2014-12-13 NOTE — Telephone Encounter (Signed)
Letter provided. Copy is in chart.

## 2014-12-13 NOTE — Telephone Encounter (Signed)
Okay to write out a work

## 2014-12-13 NOTE — Telephone Encounter (Signed)
Okay for light duty for 1 more week and return to work before office visit if she is feeling okay

## 2014-12-13 NOTE — Telephone Encounter (Signed)
Encounter already opened. 

## 2014-12-26 ENCOUNTER — Ambulatory Visit: Payer: 59 | Admitting: Gynecology

## 2014-12-27 ENCOUNTER — Ambulatory Visit (INDEPENDENT_AMBULATORY_CARE_PROVIDER_SITE_OTHER): Payer: 59 | Admitting: Family Medicine

## 2014-12-27 ENCOUNTER — Ambulatory Visit (INDEPENDENT_AMBULATORY_CARE_PROVIDER_SITE_OTHER): Payer: 59 | Admitting: Gynecology

## 2014-12-27 ENCOUNTER — Encounter: Payer: Self-pay | Admitting: Family Medicine

## 2014-12-27 ENCOUNTER — Encounter: Payer: Self-pay | Admitting: Gynecology

## 2014-12-27 VITALS — BP 134/86

## 2014-12-27 VITALS — BP 124/88 | HR 81 | Temp 98.7°F | Resp 16 | Ht 64.0 in | Wt 195.1 lb

## 2014-12-27 DIAGNOSIS — E669 Obesity, unspecified: Secondary | ICD-10-CM | POA: Diagnosis not present

## 2014-12-27 DIAGNOSIS — M5136 Other intervertebral disc degeneration, lumbar region: Secondary | ICD-10-CM | POA: Insufficient documentation

## 2014-12-27 DIAGNOSIS — R011 Cardiac murmur, unspecified: Secondary | ICD-10-CM | POA: Diagnosis not present

## 2014-12-27 DIAGNOSIS — G8929 Other chronic pain: Secondary | ICD-10-CM | POA: Insufficient documentation

## 2014-12-27 DIAGNOSIS — M549 Dorsalgia, unspecified: Secondary | ICD-10-CM

## 2014-12-27 DIAGNOSIS — I10 Essential (primary) hypertension: Secondary | ICD-10-CM

## 2014-12-27 DIAGNOSIS — Z9889 Other specified postprocedural states: Secondary | ICD-10-CM

## 2014-12-27 MED ORDER — HYDROCHLOROTHIAZIDE 12.5 MG PO TABS: 12.5000 mg | ORAL_TABLET | Freq: Every day | ORAL | Status: DC

## 2014-12-27 NOTE — Progress Notes (Signed)
Name: Sherry Sampson   MRN: [id number]    DOB: [birth year]   Date:12/27/2014       Progress Note  Subjective  Chief Complaint  Chief Complaint  Patient presents with  . Establish Care  . Hypertension    patient's BP was elevated when she had her tubal ligation recently.    HPI  Sherry Sampson is a 44 y.o. female here today to transition care of medical needs to a primary care provider. She recently had a tubal ligation and during pre-surgical vitals her BP was high, 181/100, although she was asymptomatic. She has never had her blood pressure up that high. She denies episodic headaches, palpitations, dizziness, chest pain. [name] is currently not on any medications. Risk factors include obesity but recent lab work done earlier this year did not find HLD, Hba1c was 5.2%, Kidney function normal, CBC done most recently 11/30/14 no anemia. Sherry Sampson is also appropriately concerned about her weight which she feels contributes to her fluctuating blood pressures. She is not engaging in any regular exercise or diet method to lose weight.   Past Medical History  Diagnosis Date  . Leiomyoma 2013    11x55mm, 10x52mm  . Hypertension     no meds.. pt states long time ago    Past Surgical History  Procedure Laterality Date  . Hysteroscopy      D & C  . Breast surgery      BREAST REDUCTION  . Back surgery    . Laparoscopic bilateral salpingectomy Bilateral 12/05/2014    Procedure: LAPAROSCOPIC BILATERAL SALPINGECTOMY AND FULGURATION OF ENDOMETRIOSIS;  Surgeon: Anastasio Auerbach, MD;  Location: [facility];  Service: Gynecology;  Laterality: Bilateral;  . Dilitation & currettage/hystroscopy with novasure ablation Bilateral 12/05/2014    Procedure: DILATATION & CURETTAGE/HYSTEROSCOPY WITH NOVASURE ABLATION;  Surgeon: Anastasio Auerbach, MD;  Location: [facility];  Service: Gynecology;  Laterality: Bilateral;  . Tubal ligation      Family History  Problem Relation Age of Onset  . Diabetes Maternal  Grandmother   . Cancer Paternal Grandmother     COLON  . Hypertension Father     Social History   Social History  . Marital Status: Divorced    Spouse Name: N/A  . Number of Children: N/A  . Years of Education: N/A   Occupational History  . Not on file.   Social History Main Topics  . Smoking status: Never Smoker   . Smokeless tobacco: Never Used  . Alcohol Use: No  . Drug Use: No  . Sexual Activity:    Partners: Male    Birth Control/ Protection: Pill     Comment: interourse age 44, sexual partners more than 5   Other Topics Concern  . Not on file   Social History Narrative     Current outpatient prescriptions:  .  aspirin-acetaminophen-caffeine ([place]) 250-250-65 MG per tablet, Take 2 tablets by mouth every 6 (six) hours as needed for migraine., Disp: , Rfl:  .  BIOTIN PO, Take 1 tablet by mouth daily., Disp: , Rfl:  .  FOLIC ACID PO, Take 1 tablet by mouth daily., Disp: , Rfl:   No Known Allergies   ROS  CONSTITUTIONAL: No significant weight changes, fever, chills, weakness or fatigue.  HEENT:  - Eyes: No visual changes.  - Ears: No auditory changes. No pain.  - Nose: No sneezing, congestion, runny nose. - Throat: No sore throat. No changes in swallowing. SKIN: No rash or itching.  CARDIOVASCULAR:  No chest pain, chest pressure or chest discomfort. No palpitations or edema.  RESPIRATORY: No shortness of breath, cough or sputum.  NEUROLOGICAL: No headache, dizziness, syncope, paralysis, ataxia, numbness or tingling in the extremities. No memory changes. No change in bowel or bladder control.  MUSCULOSKELETAL: No joint pain. No muscle pain. ENDOCRINOLOGIC: No reports of sweating, cold or heat intolerance. No polyuria or polydipsia.   Objective  Filed Vitals:   12/27/14 1444  BP: 124/88  Pulse: 81  Temp: 98.7 F (37.1 C)  TempSrc: Oral  Resp: 16  Height: 5\' 4"  (1.626 m)  Weight: 195 lb 1.6 oz (88.497 kg)  SpO2: 99%   Body mass index is  33.47 kg/(m^2).  Physical Exam  Constitutional: Patient is overweight and well-nourished. In no distress.  HEENT:  - Head: Normocephalic and atraumatic.  - Ears: Bilateral TMs gray, no erythema or effusion - Nose: Nasal mucosa moist - Mouth/Throat: Oropharynx is clear and moist. No tonsillar hypertrophy or erythema. No post nasal drainage.  - Eyes: Conjunctivae clear, EOM movements normal. PERRLA. No scleral icterus.  Neck: Normal range of motion. Neck supple. No JVD present. No thyromegaly present.  Cardiovascular: Normal rate, regular rhythm with a 2/6 SEM best heard patient's right of her sternum.  Pulmonary/Chest: Effort normal and breath sounds normal. No respiratory distress. Musculoskeletal: Normal range of motion bilateral UE and LE, no joint effusions. Peripheral vascular: Bilateral LE no edema. Psychiatric: Patient has a normal mood and affect. Behavior is normal in office today. Judgment and thought content normal in office today.   Assessment & Plan  1. Hypertension goal BP (blood pressure) < 140/90 Blood pressure reasonable today, we discussed starting an anti HTN medication vs monitoring symptoms and her BP but she felt that she would rather try a low dose "water pill". I counseled her on monitoring for hypotensive symptoms. Keep well hydrated. EKG in office NSR without atypical findings. However given new cardiac murmur (she was not told this previously, but daughter has a heart murmur) I would like for her to get an Echo cardiogram.  - EKG 12-Lead - Ambulatory referral to Cardiology - hydrochlorothiazide (HYDRODIURIL) 12.5 MG tablet; Take 1 tablet (12.5 mg total) by mouth daily.  Dispense: 30 tablet; Refill: 3  2. Heart murmur New finding per patient. See first A&P  - EKG 12-Lead - Ambulatory referral to Cardiology  3. Obesity, Class I, BMI 30-34.9 The patient has been counseled on their higher than normal BMI.  They have verbally expressed understanding their  increased risk for other diseases.  In efforts to meet a better target BMI goal the patient has been counseled on lifestyle, diet and exercise modification tactics. Start with moderate intensity aerobic exercise (walking, jogging, elliptical, swimming, group or individual sports, hiking) at least 77mins a day at least 4 days a week and increase intensity, duration, frequency as tolerated. Diet should include well balance fresh fruits and vegetables avoiding processed foods, carbohydrates and sugars. Drink at least 8oz 10 glasses a day avoiding sodas, sugary fruit drinks, sweetened tea. Check weight on a reliable scale daily and monitor weight loss progress daily. Consider investing in mobile phone apps that will help keep track of weight loss goals.

## 2014-12-27 NOTE — Patient Instructions (Signed)
Follow up February 2017 when due for annual exam, sooner if any issues.

## 2014-12-27 NOTE — Progress Notes (Signed)
Sherry Sampson 03-12-1971 [id number]        44 y.o.  G1P1 Presents for postoperative visit status post laparoscopic bilateral salpingectomy and NovaSure endometrial ablation with hysteroscopy D&C. Doing well without complaints.  Past medical history,surgical history, problem list, medications, allergies, family history and social history were all reviewed and documented in the EPIC chart.  Directed ROS with pertinent positives and negatives documented in the history of present illness/assessment and plan.  Exam: Kim assistant Filed Vitals:   12/27/14 1208  BP: 134/86   General appearance:  Normal Abdomen soft nontender without masses guarding rebound. Incisions well-healed. Pelvic external BUS vagina normal. Cervix normal. Uterus grossly normal midline mobile nontender. Adnexa without masses or tenderness.  Assessment/Plan:  44 y.o. G1P1 with normal postoperative visit status post laparoscopic bilateral salpingectomy NovaSure endometrial ablation. Reviewed findings of the surgery with her to include normal pathology and pictures. Patient will keep menstrual calendar as long as doing well then will follow up in February 2017 when due for annual exam, sooner if any issues.    Anastasio Auerbach MD, 12:27 PM 12/27/2014

## 2015-01-05 ENCOUNTER — Encounter: Payer: Self-pay | Admitting: *Deleted

## 2015-05-02 ENCOUNTER — Ambulatory Visit (INDEPENDENT_AMBULATORY_CARE_PROVIDER_SITE_OTHER): Payer: 59 | Admitting: Gynecology

## 2015-05-02 ENCOUNTER — Encounter: Payer: Self-pay | Admitting: Gynecology

## 2015-05-02 VITALS — BP 120/76 | Ht 65.0 in | Wt 190.0 lb

## 2015-05-02 DIAGNOSIS — Z01419 Encounter for gynecological examination (general) (routine) without abnormal findings: Secondary | ICD-10-CM | POA: Diagnosis not present

## 2015-05-02 NOTE — Patient Instructions (Signed)
Call to Schedule your mammogram  Facilities in [place]: 1)  [facility]. [address] [place] Phone: [phone] 2)  Dr. Isaiah Blakes at [facility] [address] Phone: [phone]     Mammogram A mammogram is an X-ray test to find changes in a woman's breast. You should get a mammogram if:  You are 43 years of age or older  You have risk factors.   Your doctor recommends that you have one.  BEFORE THE TEST  Do not schedule the test the week before your period, especially if your breasts are sore during this time.  On the day of your mammogram:  Wash your breasts and armpits well. After washing, do not put on any deodorant or talcum powder on until after your test.   Eat and drink as you usually do.   Take your medicines as usual.   If you are diabetic and take insulin, make sure you:   Eat before coming for your test.   Take your insulin as usual.   If you cannot keep your appointment, call before the appointment to cancel. Schedule another appointment.  TEST  You will need to undress from the waist up. You will put on a hospital gown.   Your breast will be put on the mammogram machine, and it will press firmly on your breast with a piece of plastic called a compression paddle. This will make your breast flatter so that the machine can X-ray all parts of your breast.   Both breasts will be X-rayed. Each breast will be X-rayed from above and from the side. An X-ray might need to be taken again if the picture is not good enough.   The mammogram will last about 15 to 30 minutes.  AFTER THE TEST Finding out the results of your test Ask when your test results will be ready. Make sure you get your test results.  Document Released: 05/30/2008 Document Revised: 02/20/2011 Document Reviewed: 05/30/2008 [facility] Patient Information 2012 [place].  You may obtain a copy of any labs that were  done today by logging onto MyChart as outlined in the instructions provided with your AVS (after visit summary). The office will not call with normal lab results but certainly if there are any significant abnormalities then we will contact you.   Health Maintenance Adopting a healthy lifestyle and getting preventive care can go a long way to promote health and wellness. Talk with your health care provider about what schedule of regular examinations is right for you. This is a good chance for you to check in with your provider about disease prevention and staying healthy. In between checkups, there are plenty of things you can do on your own. Experts have done a lot of research about which lifestyle changes and preventive measures are most likely to keep you healthy. Ask your health care provider for more information. WEIGHT AND DIET  Eat a healthy diet  Be sure to include plenty of vegetables, fruits, low-fat dairy products, and lean protein.  Do not eat a lot of foods high in solid fats, added sugars, or salt.  Get regular exercise. This is one of the most important things you can do for your health.  Most adults should exercise for at least 150 minutes each week. The exercise should increase your heart rate and make you sweat (moderate-intensity exercise).  Most adults should also do strengthening exercises at least twice a  week. This is in addition to the moderate-intensity exercise.  Maintain a healthy weight  Body mass index (BMI) is a measurement that can be used to identify possible weight problems. It estimates body fat based on height and weight. Your health care provider can help determine your BMI and help you achieve or maintain a healthy weight.  For females 20 years of age and older:   A BMI below 18.5 is considered underweight.  A BMI of 18.5 to 24.9 is normal.  A BMI of 25 to 29.9 is considered overweight.  A BMI of 30 and above is considered obese.  Watch levels of  cholesterol and blood lipids  You should start having your blood tested for lipids and cholesterol at 43 years of age, then have this test every 5 years.  You may need to have your cholesterol levels checked more often if:  Your lipid or cholesterol levels are high.  You are older than 43 years of age.  You are at high risk for heart disease.  CANCER SCREENING   Lung Cancer  Lung cancer screening is recommended for adults 55-80 years old who are at high risk for lung cancer because of a history of smoking.  A yearly low-dose CT scan of the lungs is recommended for people who:  Currently smoke.  Have quit within the past 15 years.  Have at least a 30-pack-year history of smoking. A pack year is smoking an average of one pack of cigarettes a day for 1 year.  Yearly screening should continue until it has been 15 years since you quit.  Yearly screening should stop if you develop a health problem that would prevent you from having lung cancer treatment.  Breast Cancer  Practice breast self-awareness. This means understanding how your breasts normally appear and feel.  It also means doing regular breast self-exams. Let your health care provider know about any changes, no matter how small.  If you are in your 20s or 30s, you should have a clinical breast exam (CBE) by a health care provider every 1-3 years as part of a regular health exam.  If you are 40 or older, have a CBE every year. Also consider having a breast X-ray (mammogram) every year.  If you have a family history of breast cancer, talk to your health care provider about genetic screening.  If you are at high risk for breast cancer, talk to your health care provider about having an MRI and a mammogram every year.  Breast cancer gene (BRCA) assessment is recommended for women who have family members with BRCA-related cancers. BRCA-related cancers include:  Breast.  Ovarian.  Tubal.  Peritoneal  cancers.  Results of the assessment will determine the need for genetic counseling and BRCA1 and BRCA2 testing. Cervical Cancer Routine pelvic examinations to screen for cervical cancer are no longer recommended for nonpregnant women who are considered low risk for cancer of the pelvic organs (ovaries, uterus, and vagina) and who do not have symptoms. A pelvic examination may be necessary if you have symptoms including those associated with pelvic infections. Ask your health care provider if a screening pelvic exam is right for you.   The Pap test is the screening test for cervical cancer for women who are considered at risk.  If you had a hysterectomy for a problem that was not cancer or a condition that could lead to cancer, then you no longer need Pap tests.  If you are older than 65 years, and   you have had normal Pap tests for the past 10 years, you no longer need to have Pap tests.  If you have had past treatment for cervical cancer or a condition that could lead to cancer, you need Pap tests and screening for cancer for at least 20 years after your treatment.  If you no longer get a Pap test, assess your risk factors if they change (such as having a new sexual partner). This can affect whether you should start being screened again.  Some women have medical problems that increase their chance of getting cervical cancer. If this is the case for you, your health care provider may recommend more frequent screening and Pap tests.  The human papillomavirus (HPV) test is another test that may be used for cervical cancer screening. The HPV test looks for the virus that can cause cell changes in the cervix. The cells collected during the Pap test can be tested for HPV.  The HPV test can be used to screen women 63 years of age and older. Getting tested for HPV can extend the interval between normal Pap tests from three to five years.  An HPV test also should be used to screen women of any age who  have unclear Pap test results.  After 43 years of age, women should have HPV testing as often as Pap tests.  Colorectal Cancer  This type of cancer can be detected and often prevented.  Routine colorectal cancer screening usually begins at 43 years of age and continues through 43 years of age.  Your health care provider may recommend screening at an earlier age if you have risk factors for colon cancer.  Your health care provider may also recommend using home test kits to check for hidden blood in the stool.  A small camera at the end of a tube can be used to examine your colon directly (sigmoidoscopy or colonoscopy). This is done to check for the earliest forms of colorectal cancer.  Routine screening usually begins at age 68.  Direct examination of the colon should be repeated every 5-10 years through 43 years of age. However, you may need to be screened more often if early forms of precancerous polyps or small growths are found. Skin Cancer  Check your skin from head to toe regularly.  Tell your health care provider about any new moles or changes in moles, especially if there is a change in a mole's shape or color.  Also tell your health care provider if you have a mole that is larger than the size of a pencil eraser.  Always use sunscreen. Apply sunscreen liberally and repeatedly throughout the day.  Protect yourself by wearing long sleeves, pants, a wide-brimmed hat, and sunglasses whenever you are outside. HEART DISEASE, DIABETES, AND HIGH BLOOD PRESSURE   Have your blood pressure checked at least every 1-2 years. High blood pressure causes heart disease and increases the risk of stroke.  If you are between 42 years and 28 years old, ask your health care provider if you should take aspirin to prevent strokes.  Have regular diabetes screenings. This involves taking a blood sample to check your fasting blood sugar level.  If you are at a normal weight and have a low risk for  diabetes, have this test once every three years after 43 years of age.  If you are overweight and have a high risk for diabetes, consider being tested at a younger age or more often. PREVENTING INFECTION  Hepatitis B  If you have a higher risk for hepatitis B, you should be screened for this virus. You are considered at high risk for hepatitis B if:  You were born in a country where hepatitis B is common. Ask your health care provider which countries are considered high risk.  Your parents were born in a high-risk country, and you have not been immunized against hepatitis B (hepatitis B vaccine).  You have HIV or AIDS.  You use needles to inject street drugs.  You live with someone who has hepatitis B.  You have had sex with someone who has hepatitis B.  You get hemodialysis treatment.  You take certain medicines for conditions, including cancer, organ transplantation, and autoimmune conditions. Hepatitis C  Blood testing is recommended for:  Everyone born from [birth year] through 1963.  Anyone with known risk factors for hepatitis C. Sexually transmitted infections (STIs)  You should be screened for sexually transmitted infections (STIs) including gonorrhea and chlamydia if:  You are sexually active and are younger than 43 years of age.  You are older than 43 years of age and your health care provider tells you that you are at risk for this type of infection.  Your sexual activity has changed since you were last screened and you are at an increased risk for chlamydia or gonorrhea. Ask your health care provider if you are at risk.  If you do not have HIV, but are at risk, it may be recommended that you take a prescription medicine daily to prevent HIV infection. This is called pre-exposure prophylaxis (PrEP). You are considered at risk if:  You are sexually active and do not regularly use condoms or know the HIV status of your partner(s).  You take drugs by injection.  You are  sexually active with a partner who has HIV. Talk with your health care provider about whether you are at high risk of being infected with HIV. If you choose to begin PrEP, you should first be tested for HIV. You should then be tested every 3 months for as long as you are taking PrEP.  PREGNANCY   If you are premenopausal and you may become pregnant, ask your health care provider about preconception counseling.  If you may become pregnant, take 400 to 800 micrograms (mcg) of folic acid every day.  If you want to prevent pregnancy, talk to your health care provider about birth control (contraception). OSTEOPOROSIS AND MENOPAUSE   Osteoporosis is a disease in which the bones lose minerals and strength with aging. This can result in serious bone fractures. Your risk for osteoporosis can be identified using a bone density scan.  If you are 32 years of age or older, or if you are at risk for osteoporosis and fractures, ask your health care provider if you should be screened.  Ask your health care provider whether you should take a calcium or vitamin D supplement to lower your risk for osteoporosis.  Menopause may have certain physical symptoms and risks.  Hormone replacement therapy may reduce some of these symptoms and risks. Talk to your health care provider about whether hormone replacement therapy is right for you.  HOME CARE INSTRUCTIONS   Schedule regular health, dental, and eye exams.  Stay current with your immunizations.   Do not use any tobacco products including cigarettes, chewing tobacco, or electronic cigarettes.  If you are pregnant, do not drink alcohol.  If you are breastfeeding, limit how much and how often you drink alcohol.  Limit alcohol  intake to no more than 1 drink per day for nonpregnant women. One drink equals 12 ounces of beer, 5 ounces of wine, or 1 ounces of hard liquor.  Do not use street drugs.  Do not share needles.  Ask your health care provider for  help if you need support or information about quitting drugs.  Tell your health care provider if you often feel depressed.  Tell your health care provider if you have ever been abused or do not feel safe at home. Document Released: 09/16/2010 Document Revised: 07/18/2013 Document Reviewed: 02/02/2013 [facility] Patient Information 2015 [place], [place]. This information is not intended to replace advice given to you by your health care provider. Make sure you discuss any questions you have with your health care provider.

## 2015-05-02 NOTE — Progress Notes (Signed)
Sherry Sampson [birth year] [id number]        45 y.o.  G1P1  for annual exam.  Doing well.  Past medical history,surgical history, problem list, medications, allergies, family history and social history were all reviewed and documented as reviewed in the EPIC chart.  ROS:  Performed with pertinent positives and negatives included in the history, assessment and plan.   Additional significant findings :  none   Exam: Sherry Sampson assistant Filed Vitals:   05/02/15 1518  BP: 120/76  Height: 5\' 5"  (1.651 m)  Weight: 190 lb (86.183 kg)   General appearance:  Normal affect, orientation and appearance. Skin: Grossly normal HEENT: Without gross lesions.  No cervical or supraclavicular adenopathy. Thyroid normal.  Lungs:  Clear without wheezing, rales or rhonchi Cardiac: RR, without RMG Abdominal:  Soft, nontender, without masses, guarding, rebound, organomegaly or hernia Breasts:  Examined lying and sitting without masses, retractions, discharge or axillary adenopathy.  Well-healed bilateral reduction scars Pelvic:  Ext/BUS/vagina normal  Cervix normal  Uterus anteverted, normal size, shape and contour, midline and mobile nontender   Adnexa without masses or tenderness    Anus and perineum normal   Rectovaginal normal sphincter tone without palpated masses or tenderness.    Assessment/Plan:  45 y.o. G1P1 female for annual exam with regular menses, tubal sterilization.   1. History of NovaSure endometrial ablation and bilateral salpingectomies 11/2014. Regular light menses. Continue to monitor recording issues. 2. Pap smear/HPV normal 04/2014. No Pap smear done today. No history of significant abnormal Pap smears previously. 3. Mammography many years ago. Recommended baseline screening mammogram now. Importance to schedule stressed. Patient agrees to do so. Names and numbers provided. 4. Health maintenance. Baseline CBC, comprehensive metabolic panel and urinalysis ordered.  Lipid profile  last year super with total cholesterol 129 HDL 64 and LDL 54. Follow up in one year, sooner as needed.   Anastasio Auerbach MD, 3:37 PM 05/02/2015

## 2015-05-03 ENCOUNTER — Other Ambulatory Visit: Payer: Self-pay | Admitting: *Deleted

## 2015-05-03 DIAGNOSIS — R7309 Other abnormal glucose: Secondary | ICD-10-CM

## 2015-05-03 DIAGNOSIS — R7989 Other specified abnormal findings of blood chemistry: Secondary | ICD-10-CM

## 2015-05-03 LAB — COMPREHENSIVE METABOLIC PANEL
ALBUMIN: 3.9 g/dL (ref 3.6–5.1)
ALK PHOS: 95 U/L (ref 33–115)
ALT: 25 U/L (ref 6–29)
AST: 20 U/L (ref 10–30)
BILIRUBIN TOTAL: 0.6 mg/dL (ref 0.2–1.2)
BUN: 11 mg/dL (ref 7–25)
CO2: 26 mmol/L (ref 20–31)
CREATININE: 0.87 mg/dL (ref 0.50–1.10)
Calcium: 9.4 mg/dL (ref 8.6–10.2)
Chloride: 100 mmol/L (ref 98–110)
Glucose, Bld: 105 mg/dL — ABNORMAL HIGH (ref 65–99)
Potassium: 3.6 mmol/L (ref 3.5–5.3)
SODIUM: 136 mmol/L (ref 135–146)
TOTAL PROTEIN: 7.4 g/dL (ref 6.1–8.1)

## 2015-05-03 LAB — URINALYSIS W MICROSCOPIC + REFLEX CULTURE
BACTERIA UA: NONE SEEN [HPF]
Bilirubin Urine: NEGATIVE
CASTS: NONE SEEN [LPF]
CRYSTALS: NONE SEEN [HPF]
Glucose, UA: NEGATIVE
HGB URINE DIPSTICK: NEGATIVE
Leukocytes, UA: NEGATIVE
NITRITE: NEGATIVE
RBC / HPF: NONE SEEN RBC/HPF (ref <=2)
SPECIFIC GRAVITY, URINE: 1.029 (ref 1.001–1.035)
WBC, UA: NONE SEEN WBC/HPF (ref <=5)
Yeast: NONE SEEN [HPF]
pH: 5.5 (ref 5.0–8.0)

## 2015-05-03 LAB — CBC WITH DIFFERENTIAL/PLATELET
BASOS PCT: 0 % (ref 0–1)
Basophils Absolute: 0 10*3/uL (ref 0.0–0.1)
Eosinophils Absolute: 0.1 10*3/uL (ref 0.0–0.7)
Eosinophils Relative: 1 % (ref 0–5)
HEMATOCRIT: 40.5 % (ref 36.0–46.0)
HEMOGLOBIN: 13.4 g/dL (ref 12.0–15.0)
LYMPHS PCT: 50 % — AB (ref 12–46)
Lymphs Abs: 4.1 10*3/uL — ABNORMAL HIGH (ref 0.7–4.0)
MCH: 29.8 pg (ref 26.0–34.0)
MCHC: 33.1 g/dL (ref 30.0–36.0)
MCV: 90 fL (ref 78.0–100.0)
MPV: 9.6 fL (ref 8.6–12.4)
Monocytes Absolute: 0.5 10*3/uL (ref 0.1–1.0)
Monocytes Relative: 6 % (ref 3–12)
NEUTROS ABS: 3.5 10*3/uL (ref 1.7–7.7)
NEUTROS PCT: 43 % (ref 43–77)
Platelets: 249 10*3/uL (ref 150–400)
RBC: 4.5 MIL/uL (ref 3.87–5.11)
RDW: 13.7 % (ref 11.5–15.5)
WBC: 8.2 10*3/uL (ref 4.0–10.5)

## 2015-05-04 ENCOUNTER — Other Ambulatory Visit: Payer: Self-pay | Admitting: Gynecology

## 2015-05-04 NOTE — Telephone Encounter (Signed)
Patient had annual on 05/02/15.

## 2015-05-04 NOTE — Telephone Encounter (Signed)
Rx denied

## 2015-05-04 NOTE — Telephone Encounter (Signed)
Patient should no longer be taking birth control pills. She had a tubal sterilization

## 2015-06-18 ENCOUNTER — Telehealth: Payer: Self-pay | Admitting: *Deleted

## 2015-06-18 NOTE — Telephone Encounter (Signed)
Pt had annual on 05/02/15 needs form filled with labs, weight, etc. Pt will bring by office to be filled out for her job

## 2015-10-15 ENCOUNTER — Telehealth: Payer: Self-pay | Admitting: *Deleted

## 2015-10-15 NOTE — Telephone Encounter (Signed)
Pt called c/o spotting only x 3 weeks now, bilateral salpingectomies in 11/2014. Only wearing panty liner, so not heavy but daily. Please advise

## 2015-10-15 NOTE — Telephone Encounter (Signed)
Office visit at her convenience 

## 2015-10-15 NOTE — Telephone Encounter (Signed)
Pt informed, transferred to front desk to schedule. 

## 2015-10-18 ENCOUNTER — Ambulatory Visit (INDEPENDENT_AMBULATORY_CARE_PROVIDER_SITE_OTHER): Payer: 59 | Admitting: Gynecology

## 2015-10-18 ENCOUNTER — Encounter: Payer: Self-pay | Admitting: Gynecology

## 2015-10-18 VITALS — BP 122/76

## 2015-10-18 DIAGNOSIS — N921 Excessive and frequent menstruation with irregular cycle: Secondary | ICD-10-CM

## 2015-10-18 NOTE — Patient Instructions (Signed)
Call if prolonged or irregular menses continue. If they resume regular and light then follow up 1 year due for your next annual exam

## 2015-10-18 NOTE — Progress Notes (Signed)
    Sherry Sampson 1971/02/08 [id number]        45 y.o.  G1P1 presents with a history of regular light menses noting NovaSure endometrial ablation in the past. This last menses started on time 10/04/2015 but lasted up until a day or so ago. Normally has light 3-5 day flow. Also had a migraine headache and some moliminal symptoms with this cycle that she did not have previously. No stressor changes at home work. No weight, hair, skin changes.  Past medical history,surgical history, problem list, medications, allergies, family history and social history were all reviewed and documented in the EPIC chart.  Directed ROS with pertinent positives and negatives documented in the history of present illness/assessment and plan.  Exam: [name] assistant Vitals:   10/18/15 1046  BP: 122/76   General appearance:  Normal Abdomen soft nontender without masses guarding rebound Pelvic external BUS vagina normal without bleeding. Cervix normal. Uterus grossly normal size midline mobile nontender. Adnexa without masses or tenderness  Assessment/Plan:  45 y.o. G1P1 with single episode of prolonged menses. Now resolved. Suspect irregular ovulation with other hormonal symptoms such as her migraine and moliminal symptoms. I reviewed various scenarios to include or modal dysfunction to and including structural abnormalities such as polyps. Will keep menstrual calendar and if resumes regular light menses will follow. If irregular/prolonged menses continue then she'll represent for further evaluation.  Greater than 50% of my time was spent in direct face to face counseling and coordination of care with the patient.   Anastasio Auerbach MD, 10:56 AM 10/18/2015

## 2016-03-18 ENCOUNTER — Telehealth: Payer: Self-pay | Admitting: *Deleted

## 2016-03-18 NOTE — Telephone Encounter (Signed)
Left a detailed of the below on pt voicemail per her request.

## 2016-03-18 NOTE — Telephone Encounter (Signed)
I doubt pain related to the salpingectomies over a year ago. She did have evidence of endometriosis before which could account for this. Not unusual to have some spotting after ablation. If pain persists then recommend office visit and ultrasound to look at her ovaries.

## 2016-03-18 NOTE — Telephone Encounter (Signed)
Pt had bilateral salpingectomies in 11/2014 notes lower right side discomfort off and on, spotting has returned as well x 1 week today. Aware to keep menstrual calender. She asked if the discomfort is normal after tubes removed? Please advise

## 2016-03-21 ENCOUNTER — Telehealth: Payer: Self-pay

## 2016-03-21 NOTE — Telephone Encounter (Signed)
This call was sent to me from front desk. Husband calling.  I advised him at beginning of conversation that I can not release any information. He said he just wanted to schedule appointment for his wife.  Upon discussion she is having problems with bleeding like in the past. He said one day this week was heavy and she came home from work. He did not know how her bleeding was today because she was at work. Spotting x 2 days he knew.  I transferred him back to appt desk to schedule appt at requested.

## 2016-03-24 ENCOUNTER — Ambulatory Visit: Payer: 59 | Admitting: Gynecology

## 2016-04-03 ENCOUNTER — Ambulatory Visit: Payer: 59 | Admitting: Gynecology

## 2016-05-21 ENCOUNTER — Telehealth: Payer: Self-pay | Admitting: *Deleted

## 2016-05-21 ENCOUNTER — Encounter: Payer: 59 | Admitting: Gynecology

## 2016-05-21 NOTE — Telephone Encounter (Signed)
Pt called c/o bleeding after sex and sharp pelvic discomfort off and on after sex as well. Last 2 times after sex, pt said bleeding is not her cycle. I advised pt to schedule OV with provider for exam, transferred front desk to schedule.

## 2016-05-29 ENCOUNTER — Ambulatory Visit (INDEPENDENT_AMBULATORY_CARE_PROVIDER_SITE_OTHER): Payer: 59 | Admitting: Gynecology

## 2016-05-29 ENCOUNTER — Encounter: Payer: Self-pay | Admitting: Gynecology

## 2016-05-29 VITALS — BP 132/80

## 2016-05-29 DIAGNOSIS — N930 Postcoital and contact bleeding: Secondary | ICD-10-CM

## 2016-05-29 NOTE — Progress Notes (Signed)
    Sherry Sampson 12-05-1970 [id number]        46 y.o.  G1P1 presents complaining of 2 episodes of postcoital bleeding where she had flow approaching a menstrual cycle. It occurred during the last 2 intercourse is. No bleeding in between. Status post ablation 11/2014. Light regular menses since then. Is finishing menstrual cycle now. No pain with intercourse. No discharge irritation odor.  Past medical history,surgical history, problem list, medications, allergies, family history and social history were all reviewed and documented in the EPIC chart.  Directed ROS with pertinent positives and negatives documented in the history of present illness/assessment and plan.  Exam: Copywriter, advertising Vitals:   05/29/16 1237  BP: 132/80   General appearance:  Normal Abdomen soft nontender without masses guarding rebound Pelvic external BUS vagina with slight menses. Cervix normal. Uterus normal size midline mobile nontender. Adnexa without masses or tenderness.  Assessment/Plan:  46 y.o. G1P1 with 2 episodes of heavy postcoital bleeding by her history. Reviewed differential. Does not sound infectious without pain discharge odor irritation. Questionable hematometria being relieved with uterine manipulation during intercourse given her history of ablation. Other pathology such as polyps discussed. Will monitor for now as its only happened twice. If continues the patient will call and we'll start with ultrasound for pelvic surveillance/endometrial assessment.    Anastasio Auerbach MD, 12:53 PM 05/29/2016

## 2016-05-29 NOTE — Patient Instructions (Signed)
Call if the bleeding after intercourse continues and we will schedule ultrasound.

## 2016-06-12 ENCOUNTER — Encounter: Payer: 59 | Admitting: Gynecology

## 2016-06-20 ENCOUNTER — Encounter: Payer: 59 | Admitting: Gynecology

## 2017-03-24 ENCOUNTER — Encounter: Payer: 59 | Admitting: Gynecology

## 2017-05-08 ENCOUNTER — Encounter: Payer: 59 | Admitting: Gynecology

## 2017-09-30 ENCOUNTER — Encounter (HOSPITAL_COMMUNITY): Payer: Self-pay | Admitting: Emergency Medicine

## 2017-09-30 ENCOUNTER — Other Ambulatory Visit: Payer: Self-pay

## 2017-09-30 ENCOUNTER — Emergency Department (HOSPITAL_COMMUNITY)
Admission: EM | Admit: 2017-09-30 | Discharge: 2017-09-30 | Disposition: A | Discharge status: Home / Self Care | Payer: 59 | Attending: Emergency Medicine | Admitting: Emergency Medicine

## 2017-09-30 ENCOUNTER — Emergency Department (HOSPITAL_COMMUNITY): Payer: 59

## 2017-09-30 DIAGNOSIS — R51 Headache: Secondary | ICD-10-CM | POA: Diagnosis present

## 2017-09-30 DIAGNOSIS — Z7982 Long term (current) use of aspirin: Secondary | ICD-10-CM | POA: Diagnosis not present

## 2017-09-30 DIAGNOSIS — Z79899 Other long term (current) drug therapy: Secondary | ICD-10-CM | POA: Diagnosis not present

## 2017-09-30 DIAGNOSIS — I10 Essential (primary) hypertension: Secondary | ICD-10-CM | POA: Insufficient documentation

## 2017-09-30 DIAGNOSIS — R519 Headache, unspecified: Secondary | ICD-10-CM

## 2017-09-30 LAB — CBC
HCT: 41.5 % (ref 36.0–46.0)
Hemoglobin: 13.5 g/dL (ref 12.0–15.0)
MCH: 29.6 pg (ref 26.0–34.0)
MCHC: 32.5 g/dL (ref 30.0–36.0)
MCV: 91 fL (ref 78.0–100.0)
Platelets: 245 K/uL (ref 150–400)
RBC: 4.56 MIL/uL (ref 3.87–5.11)
RDW: 14.1 % (ref 11.5–15.5)
WBC: 6.8 K/uL (ref 4.0–10.5)

## 2017-09-30 LAB — BASIC METABOLIC PANEL WITH GFR
Anion gap: 8 (ref 5–15)
BUN: 9 mg/dL (ref 6–20)
CO2: 25 mmol/L (ref 22–32)
Calcium: 9.2 mg/dL (ref 8.9–10.3)
Chloride: 105 mmol/L (ref 98–111)
Creatinine, Ser: 0.89 mg/dL (ref 0.44–1.00)
GFR calc Af Amer: >60 mL/min
GFR calc non Af Amer: >60 mL/min
Glucose, Bld: 93 mg/dL (ref 70–99)
Potassium: 3.7 mmol/L (ref 3.5–5.1)
Sodium: 138 mmol/L (ref 135–145)

## 2017-09-30 LAB — I-STAT TROPONIN, ED: Troponin i, poc: 0 ng/mL (ref 0.00–0.08)

## 2017-09-30 LAB — I-STAT BETA HCG BLOOD, ED (MC, WL, AP ONLY): I-stat hCG, quantitative: <5 m[IU]/mL

## 2017-09-30 MED ORDER — ACETAMINOPHEN 500 MG PO TABS
1000.0000 mg | ORAL_TABLET | Freq: Once | ORAL | Status: AC
  Administered 2017-09-30: 1000 mg via ORAL
  Filled 2017-09-30: qty 2

## 2017-09-30 MED ORDER — AMLODIPINE BESYLATE 5 MG PO TABS: 5.0000 mg | ORAL_TABLET | Freq: Every day | ORAL | 0 refills | Status: DC

## 2017-09-30 MED ORDER — AMLODIPINE BESYLATE 5 MG PO TABS
2.5000 mg | ORAL_TABLET | Freq: Once | ORAL | Status: AC
  Administered 2017-09-30: 2.5 mg via ORAL
  Filled 2017-09-30: qty 1

## 2017-09-30 MED ORDER — PROCHLORPERAZINE MALEATE 5 MG PO TABS
10.0000 mg | ORAL_TABLET | Freq: Once | ORAL | Status: AC
  Administered 2017-09-30: 10 mg via ORAL
  Filled 2017-09-30: qty 2

## 2017-09-30 NOTE — ED Notes (Signed)
Pt ambulatory to bathroom

## 2017-09-30 NOTE — ED Triage Notes (Signed)
Pt presents to ED for assessment for assessment of headache upon waking up, with some dizziness and light-headedness.  Assessed by the RN at work with a BP over 527H systolic.  Patient states she was on medications, but thought her hgih BP was stress related due to a divorce and stopped taking it.   Pt c/o some chest "tingling"

## 2017-09-30 NOTE — ED Provider Notes (Signed)
[facility] EM[facility]GENCY DEPARTMENT Provider Note   CSN: [id number] Arrival date & time: 09/30/17  1238     History   Chief Complaint Chief Complaint  Patient presents with  . Headache    HPI Sherry Sampson is a 47 y.o. female.  HPI  47 year old female with past medical history as below here with mild headache.  The patient reports that she has history of hypertension but has been off of her medications.  She thought was due to stress and due to removal of the stress, she has not been taking it.  Over the last several weeks, she is had intermittent dull, retrobulbar headache with associated intermittent blurred vision.  She said associated intermittent sensation of palpitations and like she can feel her heart beating.  She is at work today when she had the symptoms.  They checked her blood pressure, and it was in the 200s so she was sent here for further evaluation.  She continues to complain of a mild headache.  Denies any cocaine or other drug use.  Denies any active chest pain or shortness of breath.  No vision changes.  Denies any floaters or other changes in her vision.  No lower extremity weakness or swelling.  No other complaints.  Headache is dull, retrobulbar, seems to come and go randomly.  No alleviating factors.   Past Medical History:  Diagnosis Date  . Hypertension    no meds.. pt states long time ago  . Leiomyoma 2013   11x43mm, 10x26mm    Patient Active Problem List   Diagnosis Date Noted  . Back pain, chronic 12/27/2014  . DDD (degenerative disc disease), lumbar 12/27/2014  . Hypertension goal BP (blood pressure) < 140/90 12/27/2014  . Heart murmur 12/27/2014  . Obesity, Class I, BMI 30-34.9 05/13/2013    Past Surgical History:  Procedure Laterality Date  . BACK SURGERY    . BREAST SURGERY     BREAST REDUCTION  . DILITATION & CURRETTAGE/HYSTROSCOPY WITH NOVASURE ABLATION Bilateral 12/05/2014   Procedure: DILATATION & CURETTAGE/HYSTEROSCOPY  WITH NOVASURE ABLATION;  Surgeon: Anastasio Auerbach, MD;  Location: [facility];  Service: Gynecology;  Laterality: Bilateral;  . HYSTEROSCOPY     D & C  . LAPAROSCOPIC BILATERAL SALPINGECTOMY Bilateral 12/05/2014   Procedure: LAPAROSCOPIC BILATERAL SALPINGECTOMY AND FULGURATION OF ENDOMETRIOSIS;  Surgeon: Anastasio Auerbach, MD;  Location: [facility];  Service: Gynecology;  Laterality: Bilateral;     OB History    Gravida  1   Para  1   Term      Preterm      AB      Living  1     SAB      TAB      Ectopic      Multiple      Live Births               Home Medications    Prior to Admission medications   Medication Sig Start Date End Date Taking? Authorizing Provider  amLODipine (NORVASC) 5 MG tablet Take 1 tablet (5 mg total) by mouth daily. 09/30/17   Duffy Bruce, MD  aspirin-acetaminophen-caffeine (EXCEDRIN MIGRAINE) [phone] MG per tablet Take 2 tablets by mouth every 6 (six) hours as needed for migraine.    [provider]  BIOTIN PO Take 1 tablet by mouth daily.    [provider]  FOLIC ACID PO Take 1 tablet by mouth daily.    [provider]  hydrochlorothiazide (HYDRODIURIL) 12.5 MG tablet Take 1 tablet (12.5 mg total) by mouth daily. Patient not taking: Reported on 10/18/2015 12/27/14   Bobetta Lime, MD    Family History Family History  Problem Relation Age of Onset  . Diabetes Maternal Grandmother   . Cancer Paternal Grandmother        COLON  . Hypertension Father     Social History Social History   Tobacco Use  . Smoking status: Never Smoker  . Smokeless tobacco: Never Used  Substance Use Topics  . Alcohol use: No    Alcohol/week: 0.0 oz  . Drug use: No     Allergies   Patient has no known allergies.   Review of Systems Review of Systems  Constitutional: Positive for fatigue. Negative for chills and fever.  HENT: Negative for congestion and rhinorrhea.   Eyes: Negative for visual disturbance.    Respiratory: Negative for cough, shortness of breath and wheezing.   Cardiovascular: Positive for palpitations. Negative for chest pain and leg swelling.  Gastrointestinal: Negative for abdominal pain, diarrhea, nausea and vomiting.  Genitourinary: Negative for dysuria and flank pain.  Musculoskeletal: Negative for neck pain and neck stiffness.  Skin: Negative for rash and wound.  Allergic/Immunologic: Negative for immunocompromised state.  Neurological: Positive for headaches. Negative for syncope and weakness.  All other systems reviewed and are negative.    Physical Exam Updated Vital Signs BP (!) 157/93 (BP Location: Right Arm)   Pulse 67   Temp 98.8 F (37.1 C) (Oral)   Resp 16   LMP 09/16/2017   SpO2 100%   Physical Exam  Constitutional: She is oriented to person, place, and time. She appears well-developed and well-nourished. No distress.  HENT:  Head: Normocephalic and atraumatic.  Eyes: Conjunctivae are normal.  Neck: Neck supple.  Cardiovascular: Normal rate, regular rhythm and normal heart sounds. Exam reveals no friction rub.  No murmur heard. Pulmonary/Chest: Effort normal and breath sounds normal. No respiratory distress. She has no wheezes. She has no rales.  Abdominal: She exhibits no distension.  Musculoskeletal: She exhibits no edema.  Neurological: She is alert and oriented to person, place, and time. She exhibits normal muscle tone.  Skin: Skin is warm. Capillary refill takes less than 2 seconds.  Psychiatric: She has a normal mood and affect.  Nursing note and vitals reviewed.   Neurological Exam:  Mental Status: Alert and oriented to person, place, and time. Attention and concentration normal. Speech clear. Recent memory is intact. Cranial Nerves: Visual fields grossly intact. EOMI and PERRLA. No nystagmus noted. Facial sensation intact at forehead, maxillary cheek, and chin/mandible bilaterally. No facial asymmetry or weakness. Hearing grossly normal.  Uvula is midline, and palate elevates symmetrically. Normal SCM and trapezius strength. Tongue midline without fasciculations. Motor: Muscle strength 5/5 in proximal and distal UE and LE bilaterally. No pronator drift. Muscle tone normal. Reflexes: 2+ and symmetrical in all four extremities.  Sensation: Intact to light touch in upper and lower extremities distally bilaterally.  Gait: Normal without ataxia. Coordination: Normal FTN bilaterally.    ED Treatments / Results  Labs (all labs ordered are listed, but only abnormal results are displayed) Labs Reviewed  BASIC METABOLIC PANEL  CBC  I-STAT TROPONIN, ED  I-STAT BETA HCG BLOOD, ED ([facility], [facility], [facility] ONLY)    EKG EKG Interpretation  Date/Time:  Wednesday September 30 2017 12:48:00 EDT Ventricular Rate:  73 PR Interval:  140 QRS Duration: 72 QT Interval:  396 QTC Calculation: 436 R Axis:  45 Text Interpretation:  Normal sinus rhythm Normal ECG No old tracing to compare Confirmed by Duffy Bruce [phone]) on 09/30/2017 5:10:15 PM   Radiology Dg Chest 2 View  Result Date: 09/30/2017 CLINICAL DATA:  Headache upon awakening today, some dizziness and lightheadedness, elevated blood pressure, history hypertension EXAM: CHEST - 2 VIEW COMPARISON:  None FINDINGS: Upper normal heart size. Mediastinal contours and pulmonary vascularity normal. Lungs clear. No pulmonary infiltrate, pleural effusion or pneumothorax. Mild levoconvex scoliosis post spinal fixation. No acute osseous findings. IMPRESSION: No acute abnormalities.  Is Electronically Signed   By: Lavonia Dana M.D.   On: 09/30/2017 13:22   Ct Head Wo Contrast  Result Date: 09/30/2017 CLINICAL DATA:  47 year old female with a history of headache EXAM: CT HEAD WITHOUT CONTRAST TECHNIQUE: Contiguous axial images were obtained from the base of the skull through the vertex without intravenous contrast. COMPARISON:  None. FINDINGS: Brain: No acute intracranial hemorrhage. No midline shift or mass  effect. Gray-white differentiation maintained. Unremarkable appearance of the ventricular system. Vascular: Unremarkable. Skull: No acute fracture.  No aggressive bone lesion identified. Sinuses/Orbits: Unremarkable appearance of the orbits. Mastoid air cells clear. No middle ear effusion. No significant sinus disease. Other: None IMPRESSION: Negative head CT Electronically Signed   By: Corrie Mckusick D.O.   On: 09/30/2017 16:36    Procedures Procedures (including critical care time)  Medications Ordered in ED Medications  amLODipine (NORVASC) tablet 2.5 mg (2.5 mg Oral Given 09/30/17 1536)  acetaminophen (TYLENOL) tablet 1,000 mg (1,000 mg Oral Given 09/30/17 1537)  prochlorperazine (COMPAZINE) tablet 10 mg (10 mg Oral Given 09/30/17 1537)     Initial Impression / Assessment and Plan / ED Course  I have reviewed the triage vital signs and the nursing notes.  Pertinent labs & imaging results that were available during my care of the patient were reviewed by me and considered in my medical decision making (see chart for details).     47 year old female with past medical history of hypertension here with suspected mild, symptomatic hypertension.  Her symptoms seem to correlate with elevated blood pressures and she has been previously diagnosed but is no longer medications.  No evidence of hypertensive emergency.  She has no current chest pain, EKG is nonischemic, troponin is negative.  No evidence of retinal detachment or vision changes currently.  CT head is negative for bleed or stroke.  No focal neuro deficits.  She feels markedly improved with a dose of amlodipine and headache meds.  Will place on low-dose amlodipine, refer to PCP.  Final Clinical Impressions(s) / ED Diagnoses   Final diagnoses:  Essential hypertension  Acute nonintractable headache, unspecified headache type    ED Discharge Orders        Ordered    amLODipine (NORVASC) 5 MG tablet  Daily     09/30/17 1712         Duffy Bruce, MD 09/30/17 1713

## 2017-09-30 NOTE — ED Notes (Signed)
Patient transported to CT 

## 2017-09-30 NOTE — ED Notes (Signed)
Patient verbalizes understanding of discharge instructions. Opportunity for questioning and answers were provided. Armband removed by staff, pt discharged from ED ambulatory with female companion.  

## 2017-10-15 ENCOUNTER — Encounter: Payer: 59 | Admitting: Gynecology

## 2017-10-20 LAB — HEMOGLOBIN A1C: HEMOGLOBIN A1C: 5.3 (ref 4.0–6.0)

## 2017-10-20 LAB — CBC AND DIFFERENTIAL
HEMOGLOBIN: 13.8 (ref 12.0–16.0)
WBC: 8

## 2017-10-20 LAB — BASIC METABOLIC PANEL
BUN: 11 (ref 4–21)
GLUCOSE: 117
Potassium: 4.1 (ref 3.4–5.3)
Sodium: 140 (ref 137–147)

## 2017-11-09 ENCOUNTER — Telehealth: Payer: Self-pay | Admitting: *Deleted

## 2017-11-09 NOTE — Telephone Encounter (Signed)
Patient called c/o irregularly cycle x 2 months now, has annual exam schedule on 11/12/17 states she may be spotting on this day, asked if okay to keep appointment I advised patient yes.

## 2017-11-12 ENCOUNTER — Encounter: Payer: Self-pay | Admitting: Gynecology

## 2017-11-12 ENCOUNTER — Ambulatory Visit: Payer: 59 | Admitting: Gynecology

## 2017-11-12 VITALS — BP 118/76 | Ht 65.0 in | Wt 210.0 lb

## 2017-11-12 DIAGNOSIS — N926 Irregular menstruation, unspecified: Secondary | ICD-10-CM

## 2017-11-12 DIAGNOSIS — Z13220 Encounter for screening for lipoid disorders: Secondary | ICD-10-CM

## 2017-11-12 DIAGNOSIS — Z01419 Encounter for gynecological examination (general) (routine) without abnormal findings: Secondary | ICD-10-CM | POA: Diagnosis not present

## 2017-11-12 NOTE — Progress Notes (Signed)
    Sherry Sampson 04-Dec-1970 [id number]        47 y.o.  G1P1 for annual gynecologic exam.  Patient also notes breakthrough bleeding over the last 2 months where she will have a light menses and then several weeks later a little bit of bleeding.  Regular menses proceeding this.  Status post endometrial ablation BTL in the past.  Past medical history,surgical history, problem list, medications, allergies, family history and social history were all reviewed and documented as reviewed in the EPIC chart.  ROS:  Performed with pertinent positives and negatives included in the history, assessment and plan.   Additional significant findings : None   Exam: [name] assistant Vitals:   11/12/17 1230  BP: 118/76  Weight: 210 lb (95.3 kg)  Height: 5\' 5"  (1.651 m)   Body mass index is 34.95 kg/m.  General appearance:  Normal affect, orientation and appearance. Skin: Grossly normal HEENT: Without gross lesions.  No cervical or supraclavicular adenopathy. Thyroid normal.  Lungs:  Clear without wheezing, rales or rhonchi Cardiac: RR, without RMG Abdominal:  Soft, nontender, without masses, guarding, rebound, organomegaly or hernia Breasts:  Examined lying and sitting without masses, retractions, discharge or axillary adenopathy. Pelvic:  Ext, BUS, Vagina: Normal  Cervix: Normal  Uterus: Anteverted, normal size, shape and contour, midline and mobile nontender   Adnexa: Without masses or tenderness    Anus and perineum: Normal   Rectovaginal: Normal sphincter tone without palpated masses or tenderness.    Assessment/Plan:  47 y.o. G1P1 female for annual gynecologic exam.   1. Breakthrough bleeding x2 cycles.  Endometrial ablation in the past.  BTL contraception.  Check baseline TSH.  Monitor for now.  If continues will call and pursue more involved evaluation to include ultrasound.  Possible scenarios reviewed with the patient. 2. Mammography many years ago.  Strongly recommended patient  schedule a screening mammogram.  Names and numbers provided.  Breast exam normal today. 3. Pap smear/HPV 04/2014.  No Pap smear done today.  No history of abnormal Pap smears.  Plan repeat Pap smear/HPV at 5-year interval per current screening guidelines. 4. Health maintenance.  Recently had CBC and CMP done.  Will check lipid profile along with her TSH today.  Follow-up in 1 year, sooner if irregular bleeding continues.   Anastasio Auerbach MD, 12:53 PM 11/12/2017

## 2017-11-12 NOTE — Patient Instructions (Signed)
Follow-up if irregular bleeding continues.   Call to Schedule your mammogram  Facilities in [place]: 1)  [facility] [address] [place] Phone: [phone]  Dr. Isaiah Blakes at [facility] [address] Phone: [phone]     Mammogram A mammogram is an X-ray test to find changes in a woman's breast. You should get a mammogram if:  You are 45 years of age or older  You have risk factors.   Your doctor recommends that you have one.  BEFORE THE TEST  Do not schedule the test the week before your period, especially if your breasts are sore during this time.  On the day of your mammogram:  Wash your breasts and armpits well. After washing, do not put on any deodorant or talcum powder on until after your test.   Eat and drink as you usually do.   Take your medicines as usual.   If you are diabetic and take insulin, make sure you:   Eat before coming for your test.   Take your insulin as usual.   If you cannot keep your appointment, call before the appointment to cancel. Schedule another appointment.  TEST  You will need to undress from the waist up. You will put on a hospital gown.   Your breast will be put on the mammogram machine, and it will press firmly on your breast with a piece of plastic called a compression paddle. This will make your breast flatter so that the machine can X-ray all parts of your breast.   Both breasts will be X-rayed. Each breast will be X-rayed from above and from the side. An X-ray might need to be taken again if the picture is not good enough.   The mammogram will last about 15 to 30 minutes.  AFTER THE TEST Finding out the results of your test Ask when your test results will be ready. Make sure you get your test results.  Document Released: 05/30/2008 Document Revised: 02/20/2011 Document Reviewed: 05/30/2008 [facility] Patient Information 2012 [place].

## 2017-11-13 LAB — LIPID PANEL
CHOL/HDL RATIO: 2.5 (calc) (ref <5.0)
CHOLESTEROL: 140 mg/dL (ref <200)
HDL: 57 mg/dL (ref >50)
LDL Cholesterol (Calc): 67 mg/dL (calc)
NON-HDL CHOLESTEROL (CALC): 83 mg/dL (ref <130)
Triglycerides: 82 mg/dL (ref <150)

## 2017-11-13 LAB — TSH: TSH: 1.33 mIU/L

## 2017-12-16 ENCOUNTER — Ambulatory Visit (HOSPITAL_COMMUNITY)
Admission: EM | Admit: 2017-12-16 | Discharge: 2017-12-16 | Disposition: A | Discharge status: Home / Self Care | Payer: 59 | Attending: Family Medicine | Admitting: Family Medicine

## 2017-12-16 ENCOUNTER — Encounter (HOSPITAL_COMMUNITY): Payer: Self-pay

## 2017-12-16 DIAGNOSIS — M545 Low back pain, unspecified: Secondary | ICD-10-CM

## 2017-12-16 DIAGNOSIS — M62830 Muscle spasm of back: Secondary | ICD-10-CM

## 2017-12-16 MED ORDER — DICLOFENAC SODIUM 75 MG PO TBEC: 75.0000 mg | DELAYED_RELEASE_TABLET | Freq: Two times a day (BID) | ORAL | 0 refills | Status: DC

## 2017-12-16 MED ORDER — CYCLOBENZAPRINE HCL 10 MG PO TABS: 10.0000 mg | ORAL_TABLET | Freq: Every evening | ORAL | 0 refills | Status: DC | PRN

## 2017-12-16 NOTE — Discharge Instructions (Signed)

## 2017-12-16 NOTE — ED Triage Notes (Signed)
Pt presents with back pain on lower left side she believes to be from exertion from work.

## 2017-12-22 NOTE — ED Provider Notes (Signed)
[facility]   [id number] 12/16/17 Arrival Time: 8563  ASSESSMENT & PLAN:  1. Acute left-sided low back pain without sciatica   2. Muscle spasm of back     Meds ordered this encounter  Medications  . cyclobenzaprine (FLEXERIL) 10 MG tablet    Sig: Take 1 tablet (10 mg total) by mouth at bedtime as needed for muscle spasms.    Dispense:  10 tablet    Refill:  0  . diclofenac (VOLTAREN) 75 MG EC tablet    Sig: Take 1 tablet (75 mg total) by mouth 2 (two) times daily.    Dispense:  14 tablet    Refill:  0   Encouraged ROM as tolerated. Will f/u with PCP if not improving over the next week.  Reviewed expectations re: course of current medical issues. Questions answered. Outlined signs and symptoms indicating need for more acute intervention. Patient verbalized understanding. After Visit Summary given.   SUBJECTIVE: History from: patient.  CHALISE PE is a 47 y.o. female who presents with complaint of intermittent left sided lower back discomfort. Onset gradual beginning several days ago. Injury/trama: no, but questions related to exertion at work. History of back problems: no. Discomfort described as aching without radiation. Certain movements exacerbate the described discomfort. Better with rest. Extremity sensation changes or weakness: none. Ambulatory without difficulty. Normal bowel/bladder habits. No associated abdominal pain/n/v. Self treatment: has tried nothing for pain relief.  Reports no fevers, IV drug use, or recent back surgeries or procedures.  ROS: As per HPI.   OBJECTIVE:  Vitals:   12/16/17 1546  BP: (!) 146/95  Pulse: 71  Resp: 18  Temp: 98.8 F (37.1 C)  TempSrc: Oral  SpO2: 98%    General appearance: alert; no distress Neck: supple with FROM; without midline tenderness Lungs: unlabored respirations; symmetrical air entry Abdomen: soft, non-tender; bowel sounds normal; no masses or organomegaly; no guarding or rebound  tenderness Back: left sided lower tenderness present over lumbar musculature; FROM at hips with mild discomfort reported; bruising: none; without midline tenderness Extremities: no cyanosis or edema; symmetrical with no gross deformities Skin: warm and dry Neurologic: normal gait; normal symmetric reflexes; normal LE strength and sensation Psychological: alert and cooperative; normal mood and affect   No Known Allergies  Past Medical History:  Diagnosis Date  . Hypertension    no meds.. pt states long time ago  . Leiomyoma 2013   11x44mm, 10x44mm   Social History   Socioeconomic History  . Marital status: Divorced    Spouse name: Not on file  . Number of children: Not on file  . Years of education: Not on file  . Highest education level: Not on file  Occupational History  . Not on file  Social Needs  . Financial resource strain: Not on file  . Food insecurity:    Worry: Not on file    Inability: Not on file  . Transportation needs:    Medical: Not on file    Non-medical: Not on file  Tobacco Use  . Smoking status: Never Smoker  . Smokeless tobacco: Never Used  Substance and Sexual Activity  . Alcohol use: No    Alcohol/week: 0.0 standard drinks  . Drug use: No  . Sexual activity: Not Currently    Partners: Male    Birth control/protection: Surgical    Comment: interourse age 60, sexual partners more than 5--Tubes removed  Lifestyle  . Physical activity:    Days per week: Not on  file    Minutes per session: Not on file  . Stress: Not on file  Relationships  . Social connections:    Talks on phone: Not on file    Gets together: Not on file    Attends religious service: Not on file    Active member of club or organization: Not on file    Attends meetings of clubs or organizations: Not on file    Relationship status: Not on file  . Intimate partner violence:    Fear of current or ex partner: Not on file    Emotionally abused: Not on file    Physically abused:  Not on file    Forced sexual activity: Not on file  Other Topics Concern  . Not on file  Social History Narrative  . Not on file   Family History  Problem Relation Age of Onset  . Diabetes Maternal Grandmother   . Cancer Paternal Grandmother        COLON  . Hypertension Father    Past Surgical History:  Procedure Laterality Date  . BACK SURGERY    . BREAST SURGERY     BREAST REDUCTION  . DILITATION & CURRETTAGE/HYSTROSCOPY WITH NOVASURE ABLATION Bilateral 12/05/2014   Procedure: DILATATION & CURETTAGE/HYSTEROSCOPY WITH NOVASURE ABLATION;  Surgeon: Anastasio Auerbach, MD;  Location: [facility];  Service: Gynecology;  Laterality: Bilateral;  . HYSTEROSCOPY     D & C  . LAPAROSCOPIC BILATERAL SALPINGECTOMY Bilateral 12/05/2014   Procedure: LAPAROSCOPIC BILATERAL SALPINGECTOMY AND FULGURATION OF ENDOMETRIOSIS;  Surgeon: Anastasio Auerbach, MD;  Location: [facility];  Service: Gynecology;  Laterality: Bilateral;     Vanessa Kick, MD 12/22/17 [phone]

## 2018-01-16 ENCOUNTER — Encounter: Payer: Self-pay | Admitting: Internal Medicine

## 2018-01-21 ENCOUNTER — Ambulatory Visit: Payer: 59 | Admitting: Internal Medicine

## 2018-01-26 ENCOUNTER — Other Ambulatory Visit: Payer: Self-pay | Admitting: Internal Medicine

## 2018-02-23 ENCOUNTER — Other Ambulatory Visit: Payer: Self-pay | Admitting: Internal Medicine

## 2018-03-30 ENCOUNTER — Other Ambulatory Visit: Payer: Self-pay | Admitting: Internal Medicine

## 2018-04-07 ENCOUNTER — Other Ambulatory Visit: Payer: Self-pay | Admitting: Internal Medicine

## 2018-04-20 ENCOUNTER — Ambulatory Visit: Payer: 59 | Admitting: Internal Medicine

## 2018-04-20 ENCOUNTER — Encounter: Payer: Self-pay | Admitting: Internal Medicine

## 2018-04-20 VITALS — BP 126/80 | HR 77 | Temp 98.5°F | Ht 64.4 in | Wt 209.8 lb

## 2018-04-20 DIAGNOSIS — Z77120 Contact with and (suspected) exposure to mold (toxic): Secondary | ICD-10-CM

## 2018-04-20 DIAGNOSIS — Z6835 Body mass index (BMI) 35.0-35.9, adult: Secondary | ICD-10-CM

## 2018-04-20 DIAGNOSIS — R011 Cardiac murmur, unspecified: Secondary | ICD-10-CM

## 2018-04-20 DIAGNOSIS — I10 Essential (primary) hypertension: Secondary | ICD-10-CM

## 2018-04-20 DIAGNOSIS — R5383 Other fatigue: Secondary | ICD-10-CM

## 2018-04-20 MED ORDER — AMLODIPINE BESYLATE 5 MG PO TABS: 5.0000 mg | ORAL_TABLET | Freq: Every day | ORAL | 2 refills | Status: DC

## 2018-04-20 NOTE — Progress Notes (Signed)
602 Subjective:     Patient ID: Sherry Sampson , female    DOB: Sep 02, 1970 , 48 y.o.   MRN: [id number]   Chief Complaint  Patient presents with  . Hypertension    HPI  Pt is here for FU HTN. BP's at home are around 140-145/ 85. She found out in the past 2 months she has mold in her apartment. Pt has had a cough x 2 months, off and on, wants to take naps, and sleeps well at night, has trouble focusing, feels foggy headed. She has not been exercising, but will start [place].    Past Medical History:  Diagnosis Date  . Hypertension    no meds.. pt states long time ago  . Leiomyoma 2013   11x96mm, 10x74mm     Family History  Problem Relation Age of Onset  . Diabetes Maternal Grandmother   . Cancer Paternal Grandmother        COLON  . Hypertension Father      Current Outpatient Medications:  .  amLODipine (NORVASC) 5 MG tablet, TAKE 1 TABLET BY MOUTH EVERY DAY, Disp: 30 tablet, Rfl: 0 .  aspirin-acetaminophen-caffeine (EXCEDRIN MIGRAINE) 024-097-35 MG per tablet, Take 2 tablets by mouth every 6 (six) hours as needed for migraine., Disp: , Rfl:  .  BIOTIN PO, Take 1 tablet by mouth daily., Disp: , Rfl:    No Known Allergies   Review of Systems  Constitutional: Positive for fatigue. Negative for activity change, appetite change, chills, diaphoresis and fever.  HENT: Positive for postnasal drip. Negative for congestion, ear discharge, ear pain, rhinorrhea, sinus pain, sore throat, trouble swallowing and voice change.   Eyes: Negative for visual disturbance.  Respiratory: Negative for chest tightness.   Cardiovascular: Negative for chest pain, palpitations and leg swelling.  Gastrointestinal: Negative for constipation and diarrhea.  Endocrine: Negative for polydipsia and polyphagia.  Genitourinary: Negative for difficulty urinating, frequency and urgency.  Skin: Negative for rash.  Hematological: Negative for adenopathy.  Psychiatric/Behavioral: Negative for confusion.        Trouble concentrating.     Today's Vitals   04/20/18 1540  BP: 126/80  Pulse: 77  Temp: 98.5 F (36.9 C)  TempSrc: Oral  SpO2: 98%  Weight: 209 lb 12.8 oz (95.2 kg)  Height: 5' 4.4" (1.636 m)  PainSc: 0-No pain   Body mass index is 35.57 kg/m.   Objective:  Physical Exam   Constitutional: She is oriented to person, place, and time. She appears well-developed and well-nourished. No distress. She did not cough the entire visit.  HENT:  Head: Normocephalic and atraumatic.  Right Ear: External ear normal.  Left Ear: External ear normal.  Nose: Nose normal.  Eyes: Conjunctivae are normal. Right eye exhibits no discharge. Left eye exhibits no discharge. No scleral icterus.  Neck: Neck supple. No thyromegaly present.  No carotid bruits bilaterally  Cardiovascular: Normal rate and regular rhythm.  1-2/6 murmur heard which I did not hear last time. Pulmonary/Chest: Effort normal and breath sounds normal. No respiratory distress.  Musculoskeletal: Normal range of motion. She exhibits no edema. Lymphadenopathy: She has no cervical adenopathy.  Neurological: She is alert and oriented to person, place, and time.  Skin: Skin is warm and dry. Capillary refill takes less than 2 seconds. No rash noted. She is not diaphoretic.  Psychiatric: She has a normal mood and affect. Her behavior is normal. Judgment and thought content normal.  Nursing note reviewed.  Assessment And Plan:  1. Heart murmur- new - Ambulatory referral to Cardiology  2. Hypertension goal BP (blood pressure) - controlled right now. She was asked to do bring her BP cuff to compare with ours during MA visit.  - Ambulatory referral to Cardiology 3- exposure to mold- acute. Mold testing ordered. We will inform her of the results     when back. Lung exam is normal and pulse ox is normal, so I don't feel I need to    order a chest xray. I will await on this test first.  Test ordered as noted from urine and blood.        Harshal Sirmon RODRIGUEZ-SOUTHWORTH, PA-C

## 2018-04-20 NOTE — Patient Instructions (Addendum)
  DONT FORGET TO MAKE AN MA VISIT AND BRING YOUR BP CUFF TO COMPARE WITH OURS.    Check on his web site to see any mold protocols he has to detox. He suffered with mold problems for years.   [place] and Author  [email or web address]

## 2018-04-22 LAB — CMP14 + ANION GAP
A/G RATIO: 1.1 — AB (ref 1.2–2.2)
ALT: 13 IU/L (ref 0–32)
ANION GAP: 15 mmol/L (ref 10.0–18.0)
AST: 17 IU/L (ref 0–40)
Albumin: 3.8 g/dL (ref 3.8–4.8)
Alkaline Phosphatase: 92 IU/L (ref 39–117)
BUN/Creatinine Ratio: 15 (ref 9–23)
BUN: 11 mg/dL (ref 6–24)
Bilirubin Total: 0.3 mg/dL (ref 0.0–1.2)
CALCIUM: 9 mg/dL (ref 8.7–10.2)
CHLORIDE: 102 mmol/L (ref 96–106)
CO2: 22 mmol/L (ref 20–29)
CREATININE: 0.71 mg/dL (ref 0.57–1.00)
GFR, EST AFRICAN AMERICAN: 119 mL/min/{1.73_m2} (ref >59)
GFR, EST NON AFRICAN AMERICAN: 103 mL/min/{1.73_m2} (ref >59)
GLUCOSE: 104 mg/dL — AB (ref 65–99)
Globulin, Total: 3.4 g/dL (ref 1.5–4.5)
Potassium: 3.9 mmol/L (ref 3.5–5.2)
Sodium: 139 mmol/L (ref 134–144)
TOTAL PROTEIN: 7.2 g/dL (ref 6.0–8.5)

## 2018-04-22 LAB — CBC
HEMOGLOBIN: 12.6 g/dL (ref 11.1–15.9)
Hematocrit: 37.5 % (ref 34.0–46.6)
MCH: 29 pg (ref 26.6–33.0)
MCHC: 33.6 g/dL (ref 31.5–35.7)
MCV: 86 fL (ref 79–97)
Platelets: 304 10*3/uL (ref 150–450)
RBC: 4.35 x10E6/uL (ref 3.77–5.28)
RDW: 12.9 % (ref 11.7–15.4)
WBC: 8.6 10*3/uL (ref 3.4–10.8)

## 2018-04-22 LAB — LIPID PANEL
Chol/HDL Ratio: 2.5 ratio (ref 0.0–4.4)
Cholesterol, Total: 125 mg/dL (ref 100–199)
HDL: 50 mg/dL (ref >39)
LDL CALC: 57 mg/dL (ref 0–99)
Triglycerides: 89 mg/dL (ref 0–149)
VLDL Cholesterol Cal: 18 mg/dL (ref 5–40)

## 2018-04-22 LAB — M024-IGE STACHYBOTRYS ATRA

## 2018-05-21 ENCOUNTER — Ambulatory Visit: Payer: 59 | Admitting: Cardiology

## 2018-05-21 ENCOUNTER — Encounter: Payer: Self-pay | Admitting: Cardiology

## 2018-05-21 VITALS — BP 147/90 | HR 79 | Ht 64.0 in | Wt 214.0 lb

## 2018-05-21 DIAGNOSIS — R0683 Snoring: Secondary | ICD-10-CM | POA: Diagnosis not present

## 2018-05-21 DIAGNOSIS — I10 Essential (primary) hypertension: Secondary | ICD-10-CM

## 2018-05-21 DIAGNOSIS — R011 Cardiac murmur, unspecified: Secondary | ICD-10-CM | POA: Diagnosis not present

## 2018-05-21 NOTE — Patient Instructions (Signed)
Increase amlodipine to 2 tablets of 5 mg daily.  Will recheck blood pressure at next visit.    Hypertension Hypertension, commonly called high blood pressure, is when the force of blood pumping through the arteries is too strong. The arteries are the blood vessels that carry blood from the heart throughout the body. Hypertension forces the heart to work harder to pump blood and may cause arteries to become narrow or stiff. Having untreated or uncontrolled hypertension can cause heart attacks, strokes, kidney disease, and other problems. A blood pressure reading consists of a higher number over a lower number. Ideally, your blood pressure should be below 120/80. The first ("top") number is called the systolic pressure. It is a measure of the pressure in your arteries as your heart beats. The second ("bottom") number is called the diastolic pressure. It is a measure of the pressure in your arteries as the heart relaxes. What are the causes? The cause of this condition is not known. What increases the risk? Some risk factors for high blood pressure are under your control. Others are not. Factors you can change  Smoking.  Having type 2 diabetes mellitus, high cholesterol, or both.  Not getting enough exercise or physical activity.  Being overweight.  Having too much fat, sugar, calories, or salt (sodium) in your diet.  Drinking too much alcohol. Factors that are difficult or impossible to change  Having chronic kidney disease.  Having a family history of high blood pressure.  Age. Risk increases with age.  Race. You may be at higher risk if you are African-American.  Gender. Men are at higher risk than women before age 75. After age 64, women are at higher risk than men.  Having obstructive sleep apnea.  Stress. What are the signs or symptoms? Extremely high blood pressure (hypertensive crisis) may cause:  Headache.  Anxiety.  Shortness of breath.  Nosebleed.  Nausea  and vomiting.  Severe chest pain.  Jerky movements you cannot control (seizures). How is this diagnosed? This condition is diagnosed by measuring your blood pressure while you are seated, with your arm resting on a surface. The cuff of the blood pressure monitor will be placed directly against the skin of your upper arm at the level of your heart. It should be measured at least twice using the same arm. Certain conditions can cause a difference in blood pressure between your right and left arms. Certain factors can cause blood pressure readings to be lower or higher than normal (elevated) for a short period of time:  When your blood pressure is higher when you are in a health care provider's office than when you are at home, this is called white coat hypertension. Most people with this condition do not need medicines.  When your blood pressure is higher at home than when you are in a health care provider's office, this is called masked hypertension. Most people with this condition may need medicines to control blood pressure. If you have a high blood pressure reading during one visit or you have normal blood pressure with other risk factors:  You may be asked to return on a different day to have your blood pressure checked again.  You may be asked to monitor your blood pressure at home for 1 week or longer. If you are diagnosed with hypertension, you may have other blood or imaging tests to help your health care provider understand your overall risk for other conditions. How is this treated? This condition is treated by  making healthy lifestyle changes, such as eating healthy foods, exercising more, and reducing your alcohol intake. Your health care provider may prescribe medicine if lifestyle changes are not enough to get your blood pressure under control, and if:  Your systolic blood pressure is above 130.  Your diastolic blood pressure is above 80. Your personal target blood pressure may  vary depending on your medical conditions, your age, and other factors. Follow these instructions at home: Eating and drinking   Eat a diet that is high in fiber and potassium, and low in sodium, added sugar, and fat. An example eating plan is called the DASH (Dietary Approaches to Stop Hypertension) diet. To eat this way: ? Eat plenty of fresh fruits and vegetables. Try to fill half of your plate at each meal with fruits and vegetables. ? Eat whole grains, such as whole wheat pasta, brown rice, or whole grain bread. Fill about one quarter of your plate with whole grains. ? Eat or drink low-fat dairy products, such as skim milk or low-fat yogurt. ? Avoid fatty cuts of meat, processed or cured meats, and poultry with skin. Fill about one quarter of your plate with lean proteins, such as fish, chicken without skin, beans, eggs, and tofu. ? Avoid premade and processed foods. These tend to be higher in sodium, added sugar, and fat.  Reduce your daily sodium intake. Most people with hypertension should eat less than 1,500 mg of sodium a day.  Limit alcohol intake to no more than 1 drink a day for nonpregnant women and 2 drinks a day for men. One drink equals 12 oz of beer, 5 oz of wine, or 1 oz of hard liquor. Lifestyle   Work with your health care provider to maintain a healthy body weight or to lose weight. Ask what an ideal weight is for you.  Get at least 30 minutes of exercise that causes your heart to beat faster (aerobic exercise) most days of the week. Activities may include walking, swimming, or biking.  Include exercise to strengthen your muscles (resistance exercise), such as pilates or lifting weights, as part of your weekly exercise routine. Try to do these types of exercises for 30 minutes at least 3 days a week.  Do not use any products that contain nicotine or tobacco, such as cigarettes and e-cigarettes. If you need help quitting, ask your health care provider.  Monitor your  blood pressure at home as told by your health care provider.  Keep all follow-up visits as told by your health care provider. This is important. Medicines  Take over-the-counter and prescription medicines only as told by your health care provider. Follow directions carefully. Blood pressure medicines must be taken as prescribed.  Do not skip doses of blood pressure medicine. Doing this puts you at risk for problems and can make the medicine less effective.  Ask your health care provider about side effects or reactions to medicines that you should watch for. Contact a health care provider if:  You think you are having a reaction to a medicine you are taking.  You have headaches that keep coming back (recurring).  You feel dizzy.  You have swelling in your ankles.  You have trouble with your vision. Get help right away if:  You develop a severe headache or confusion.  You have unusual weakness or numbness.  You feel faint.  You have severe pain in your chest or abdomen.  You vomit repeatedly.  You have trouble breathing. Summary  Hypertension  is when the force of blood pumping through your arteries is too strong. If this condition is not controlled, it may put you at risk for serious complications.  Your personal target blood pressure may vary depending on your medical conditions, your age, and other factors. For most people, a normal blood pressure is less than 120/80.  Hypertension is treated with lifestyle changes, medicines, or a combination of both. Lifestyle changes include weight loss, eating a healthy, low-sodium diet, exercising more, and limiting alcohol. This information is not intended to replace advice given to you by your health care provider. Make sure you discuss any questions you have with your health care provider. Document Released: 03/03/2005 Document Revised: 01/30/2016 Document Reviewed: 01/30/2016 Elsevier Interactive Patient Education  2019 [employer].

## 2018-05-21 NOTE — Progress Notes (Signed)
Patient referred by Rodriguez-Southworth, S* for murmur  Subjective:   Sherry Sampson, female    DOB: Aug 26, 1970, 48 y.o.   MRN: [id number]   Chief Complaint  Patient presents with  . New Patient (Initial Visit)  . Hypertension  . Heart Murmur    HPI  48 y.o. African American female, works night shifts as a Designer, industrial/product. She exercises regularly- Zumba 2 hrs, twice a week. She denies chest pain, shortness of breath, palpitations, leg edema, orthopnea, PND, TIA/syncope. She has had hypertension treated with amlodipine 5 mg daily since 08/2017, but continues to have elevated blood pressure. She endorses snoring when she sleeps.   Past Medical History:  Diagnosis Date  . Heart murmur   . Hypertension    no meds.. pt states long time ago  . Leiomyoma 2013   11x33mm, 10x12mm     Past Surgical History:  Procedure Laterality Date  . BACK SURGERY    . BREAST SURGERY     BREAST REDUCTION  . DILITATION & CURRETTAGE/HYSTROSCOPY WITH NOVASURE ABLATION Bilateral 12/05/2014   Procedure: DILATATION & CURETTAGE/HYSTEROSCOPY WITH NOVASURE ABLATION;  Surgeon: Anastasio Auerbach, MD;  Location: [facility];  Service: Gynecology;  Laterality: Bilateral;  . HYSTEROSCOPY     D & C  . LAPAROSCOPIC BILATERAL SALPINGECTOMY Bilateral 12/05/2014   Procedure: LAPAROSCOPIC BILATERAL SALPINGECTOMY AND FULGURATION OF ENDOMETRIOSIS;  Surgeon: Anastasio Auerbach, MD;  Location: [facility];  Service: Gynecology;  Laterality: Bilateral;     Social History   Socioeconomic History  . Marital status: Divorced    Spouse name: Not on file  . Number of children: 1  . Years of education: Not on file  . Highest education level: Not on file  Occupational History  . Not on file  Social Needs  . Financial resource strain: Not on file  . Food insecurity:    Worry: Not on file    Inability: Not on file  . Transportation needs:    Medical: Not on file    Non-medical: Not on file  Tobacco Use  . Smoking  status: Never Smoker  . Smokeless tobacco: Never Used  Substance and Sexual Activity  . Alcohol use: No    Alcohol/week: 0.0 standard drinks  . Drug use: No  . Sexual activity: Not Currently    Partners: Male    Birth control/protection: Surgical    Comment: interourse age 75, sexual partners more than 5--Tubes removed  Lifestyle  . Physical activity:    Days per week: Not on file    Minutes per session: Not on file  . Stress: Not on file  Relationships  . Social connections:    Talks on phone: Not on file    Gets together: Not on file    Attends religious service: Not on file    Active member of club or organization: Not on file    Attends meetings of clubs or organizations: Not on file    Relationship status: Not on file  . Intimate partner violence:    Fear of current or ex partner: Not on file    Emotionally abused: Not on file    Physically abused: Not on file    Forced sexual activity: Not on file  Other Topics Concern  . Not on file  Social History Narrative  . Not on file     Current Outpatient Medications on File Prior to Visit  Medication Sig Dispense Refill  . amLODipine (NORVASC) 5 MG tablet Take 1  tablet (5 mg total) by mouth daily. 30 tablet 2  . aspirin-acetaminophen-caffeine (EXCEDRIN MIGRAINE) 332-951-88 MG per tablet Take 2 tablets by mouth every 6 (six) hours as needed for migraine.     No current facility-administered medications on file prior to visit.     Cardiovascular studies:  EKG 05/21/2018: Sinus rhythm 76 bpm. Nonspecific T-abnormality.   EKG 09/30/17 ([facility]): Normal sinus rhythm. Normal ECG. No old tracing to compare.   Lipid Panel     Component Value Date/Time   CHOL 125 04/20/2018 1647   TRIG 89 04/20/2018 1647   HDL 50 04/20/2018 1647   CHOLHDL 2.5 04/20/2018 1647   CHOLHDL 2.5 11/12/2017 1256   VLDL 11 04/28/2014 1210   LDLCALC 57 04/20/2018 1647   LDLCALC 67 11/12/2017 1256   CMP     Component Value Date/Time   NA 139  04/20/2018 1647   K 3.9 04/20/2018 1647   CL 102 04/20/2018 1647   CO2 22 04/20/2018 1647   GLUCOSE 104 (H) 04/20/2018 1647   GLUCOSE 93 09/30/2017 1302   BUN 11 04/20/2018 1647   CREATININE 0.71 04/20/2018 1647   CREATININE 0.87 05/02/2015 1539   CALCIUM 9.0 04/20/2018 1647   PROT 7.2 04/20/2018 1647   ALBUMIN 3.8 04/20/2018 1647   AST 17 04/20/2018 1647   ALT 13 04/20/2018 1647   ALKPHOS 92 04/20/2018 1647   BILITOT 0.3 04/20/2018 1647   GFRNONAA 103 04/20/2018 1647   GFRAA 119 04/20/2018 1647   CBC Latest Ref Rng & Units 04/20/2018 10/20/2017 09/30/2017  WBC 3.4 - 10.8 x10E3/uL 8.6 8.0 6.8  Hemoglobin 11.1 - 15.9 g/dL 12.6 13.8 13.5  Hematocrit 34.0 - 46.6 % 37.5 - 41.5  Platelets 150 - 450 x10E3/uL 304 - 245     Review of Systems  Constitution: Negative for decreased appetite, malaise/fatigue, weight gain and weight loss.  HENT: Negative for congestion.   Eyes: Negative for visual disturbance.  Cardiovascular: Negative for chest pain, claudication, dyspnea on exertion, leg swelling, palpitations and syncope.  Respiratory: Negative for shortness of breath.   Endocrine: Negative for cold intolerance.  Hematologic/Lymphatic: Does not bruise/bleed easily.  Skin: Negative for itching and rash.  Musculoskeletal: Negative for myalgias.  Gastrointestinal: Negative for abdominal pain, nausea and vomiting.  Genitourinary: Negative for dysuria.  Neurological: Negative for dizziness and weakness.  Psychiatric/Behavioral: The patient is not nervous/anxious.   All other systems reviewed and are negative.        Vitals:   05/21/18 1257  BP: (!) 147/90  Pulse: 79  SpO2: 100%    Objective:   Physical Exam  Constitutional: She is oriented to person, place, and time. She appears well-developed and well-nourished. No distress.  HENT:  Head: Normocephalic and atraumatic.  Eyes: Pupils are equal, round, and reactive to light. Conjunctivae are normal.  Neck: No JVD present.    Cardiovascular: Normal rate, regular rhythm and intact distal pulses.  Murmur (II/VI apical and aortic ejection systolic murmur) heard. Pulmonary/Chest: Effort normal and breath sounds normal. She has no wheezes. She has no rales.  Abdominal: Soft. Bowel sounds are normal. There is no rebound.  Musculoskeletal:        General: No edema.  Lymphadenopathy:    She has no cervical adenopathy.  Neurological: She is alert and oriented to person, place, and time. No cranial nerve deficit.  Skin: Skin is warm and dry.  Psychiatric: She has a normal mood and affect.  Nursing note and vitals reviewed.  Assessment & Recommendations:   48 y/o [place] American female with hypertension, murmur  1. Heart murmur Murmur likely benign-mild MR or flow murmur. Will obtain echocardiogram  2. Essential hypertension Uncontrolled. Increased amlodipine to 10 mg daily. Will recheck at next visit after echocardiogram.  3. Snoring Sleep disorder likely due to obesity, as well as shift work. Recommend sleep study to evaluate for OSA.   I will see her back in 4 weeks  Thank you for referring the patient to [place]. Please feel free to contact with any questions.  Nigel Mormon, MD [facility] Pager: [phone] Office: [phone] If no answer Cell [phone]

## 2018-06-04 ENCOUNTER — Other Ambulatory Visit: Payer: 59

## 2018-07-02 ENCOUNTER — Ambulatory Visit: Payer: 59 | Admitting: Cardiology

## 2018-07-06 ENCOUNTER — Telehealth: Payer: Self-pay

## 2018-07-06 NOTE — Telephone Encounter (Signed)
LVM for pt to call to have appt changed to a morning or virtual appt 07/06/18

## 2018-07-08 ENCOUNTER — Telehealth: Payer: Self-pay | Admitting: Internal Medicine

## 2018-07-08 NOTE — Telephone Encounter (Signed)
Pt consent to virtual phone.

## 2018-07-11 ENCOUNTER — Other Ambulatory Visit: Payer: Self-pay | Admitting: Internal Medicine

## 2018-07-13 ENCOUNTER — Other Ambulatory Visit: Payer: Self-pay

## 2018-07-13 ENCOUNTER — Telehealth: Payer: Self-pay | Admitting: Internal Medicine

## 2018-07-13 MED ORDER — AMLODIPINE BESYLATE 5 MG PO TABS: 5.0000 mg | ORAL_TABLET | Freq: Every day | ORAL | 0 refills | Status: DC

## 2018-07-13 NOTE — Telephone Encounter (Signed)
PT STATED THAT NEEDED REFILL BUT SHE IS NOT OUT BUT WILL BE BEFORE HER APPT ASKED IF APPT COULD BE MOVED UP TO BE REFILLED PT STATED THAT SHE IS WORKING AND UNABLE TO HAVE APPT BUT WILL BE OFF ON THE 5/6 APPT

## 2018-07-14 ENCOUNTER — Other Ambulatory Visit: Payer: Self-pay

## 2018-07-20 ENCOUNTER — Ambulatory Visit: Payer: 59 | Admitting: Internal Medicine

## 2018-07-21 ENCOUNTER — Ambulatory Visit: Payer: 59 | Admitting: Nurse Practitioner

## 2018-07-21 ENCOUNTER — Other Ambulatory Visit: Payer: Self-pay

## 2018-07-21 DIAGNOSIS — I10 Essential (primary) hypertension: Secondary | ICD-10-CM | POA: Diagnosis not present

## 2018-07-21 MED ORDER — AMLODIPINE BESYLATE 10 MG PO TABS: 10.0000 mg | ORAL_TABLET | Freq: Every day | ORAL | 11 refills | Status: DC

## 2018-07-21 NOTE — Progress Notes (Signed)
Virtual Visit via Video    This visit type was conducted due to national recommendations for restrictions regarding the COVID-19 Pandemic (e.g. social distancing) in an effort to limit this patient's exposure and mitigate transmission in our community.  Patients identity confirmed using two different identifiers.  This format is felt to be most appropriate for this patient at this time.  All issues noted in this document were discussed and addressed.  No physical exam was performed (except for noted visual exam findings with Video Visits).    Date:  08/09/2018   ID:  Sherry Sampson, DOB [birth year], MRN [id number]  Patient Location:  Home - spoke with Sherry Sampson   Provider location:   Office    Chief Complaint:  HTN   History of Present Illness:    Sherry Sampson is a 48 y.o. female who presents via video conferencing for a telehealth visit today.    The patient does not have symptoms concerning for COVID-19 infection (fever, chills, cough, or new shortness of breath).   She had an episode at work on Wednesday her blood pressure was 164/100. She is taking her amlodipine daily.  She works as a Writer at night.  She is drinking more water.    A few years ago while going through a divorce had elevated blood pressure thought was related to stress.  Also again last year was elevated.  154/110 after lunch  Hypertension  This is a chronic problem. The current episode started more than 1 year ago. The problem has been gradually worsening since onset. Pertinent negatives include no chest pain or palpitations.     Past Medical History:  Diagnosis Date  . Heart murmur   . Hypertension    no meds.. pt states long time ago  . Leiomyoma 2013   11x10mm, 10x22mm   Past Surgical History:  Procedure Laterality Date  . BACK SURGERY    . BREAST SURGERY     BREAST REDUCTION  . DILITATION & CURRETTAGE/HYSTROSCOPY WITH NOVASURE ABLATION Bilateral 12/05/2014   Procedure:  DILATATION & CURETTAGE/HYSTEROSCOPY WITH NOVASURE ABLATION;  Surgeon: Anastasio Auerbach, MD;  Location: [facility];  Service: Gynecology;  Laterality: Bilateral;  . HYSTEROSCOPY     D & C  . LAPAROSCOPIC BILATERAL SALPINGECTOMY Bilateral 12/05/2014   Procedure: LAPAROSCOPIC BILATERAL SALPINGECTOMY AND FULGURATION OF ENDOMETRIOSIS;  Surgeon: Anastasio Auerbach, MD;  Location: [facility];  Service: Gynecology;  Laterality: Bilateral;     Current Meds  Medication Sig  . aspirin-acetaminophen-caffeine (EXCEDRIN MIGRAINE) [phone] MG per tablet Take 2 tablets by mouth every 6 (six) hours as needed for migraine.  . [DISCONTINUED] amLODipine (NORVASC) 5 MG tablet Take 1 tablet (5 mg total) by mouth daily.     Allergies:   Patient has no known allergies.   Social History   Tobacco Use  . Smoking status: Never Smoker  . Smokeless tobacco: Never Used  Substance Use Topics  . Alcohol use: No    Alcohol/week: 0.0 standard drinks  . Drug use: No     Family Hx: The patient's family history includes Cancer in her paternal grandmother; Diabetes in her maternal grandmother; Heart murmur in her daughter and father; Hypertension in her father.  ROS:   Please see the history of present illness.    Review of Systems  Constitutional: Negative.   Respiratory: Negative.   Cardiovascular: Negative for chest pain and palpitations.  Neurological: Negative for dizziness and tingling.    All other systems reviewed and  are negative.   Labs/Other Tests and Data Reviewed:    Recent Labs: 11/12/2017: TSH 1.33 04/20/2018: ALT 13; BUN 11; Creatinine, Ser 0.71; Hemoglobin 12.6; Platelets 304; Potassium 3.9; Sodium 139   Recent Lipid Panel Lab Results  Component Value Date/Time   CHOL 125 04/20/2018 04:47 PM   TRIG 89 04/20/2018 04:47 PM   HDL 50 04/20/2018 04:47 PM   CHOLHDL 2.5 04/20/2018 04:47 PM   CHOLHDL 2.5 11/12/2017 12:56 PM   LDLCALC 57 04/20/2018 04:47 PM   LDLCALC 67 11/12/2017 12:56 PM    Wt  Readings from Last 3 Encounters:  05/21/18 214 lb (97.1 kg)  04/20/18 209 lb 12.8 oz (95.2 kg)  10/20/17 208 lb 9.6 oz (94.6 kg)     Exam:    Vital Signs:  There were no vitals taken for this visit.    Physical Exam  Constitutional: She is oriented to person, place, and time and well-developed, well-nourished, and in no distress.  Pulmonary/Chest: Effort normal.  Neurological: She is alert and oriented to person, place, and time.  Psychiatric: Mood, memory, affect and judgment normal.    ASSESSMENT & PLAN:    1. Hypertension goal BP (blood pressure) < 140/90  Will increase her amlodipine to 10mg  she is to check her blood pressure daily and keep a log  Will try to bring her in the office in 2 weeks for follow up  - amLODipine (NORVASC) 10 MG tablet; Take 1 tablet (10 mg total) by mouth daily.  Dispense: 30 tablet; Refill: 11   COVID-19 Education: The signs and symptoms of COVID-19 were discussed with the patient and how to seek care for testing (follow up with PCP or arrange E-visit).  The importance of social distancing was discussed today.  Patient Risk:   After full review of this patients clinical status, I feel that they are at least moderate risk at this time.  Time:   Today, I have spent 20 minutes/ seconds with the patient with telehealth technology discussing above diagnoses.     Medication Adjustments/Labs and Tests Ordered: Current medicines are reviewed at length with the patient today.  Concerns regarding medicines are outlined above.   Tests Ordered: No orders of the defined types were placed in this encounter.   Medication Changes: Meds ordered this encounter  Medications  . amLODipine (NORVASC) 10 MG tablet    Sig: Take 1 tablet (10 mg total) by mouth daily.    Dispense:  30 tablet    Refill:  11    Disposition:  Follow up in 4-6 weeks  Signed, Minette Brine, FNP

## 2018-08-08 ENCOUNTER — Other Ambulatory Visit: Payer: Self-pay | Admitting: Nurse Practitioner

## 2018-08-09 ENCOUNTER — Encounter: Payer: Self-pay | Admitting: Nurse Practitioner

## 2018-08-10 ENCOUNTER — Telehealth: Payer: Self-pay

## 2018-08-10 NOTE — Telephone Encounter (Signed)
Called pt to notify her that we have sent her Rx for bp cuff to [facility] and they should be contacting her soon. Also asked pt if she was checking her blood pressure at work she stated she has and the readings have been good she thinks one reading was 130/90. I advised pt to check her bp when she goes to work and write them down so she can tell us what they are. YRL,RMA

## 2018-08-10 NOTE — Telephone Encounter (Signed)
I called pt to see if she has been able to get her blood pressure readings and she stated she has not because she doesn't have a cuff pt stated she would like a Rx. YRL,RMA

## 2018-08-17 ENCOUNTER — Other Ambulatory Visit: Payer: Self-pay

## 2018-08-17 DIAGNOSIS — I10 Essential (primary) hypertension: Secondary | ICD-10-CM

## 2018-08-17 MED ORDER — AMLODIPINE BESYLATE 10 MG PO TABS: 10.0000 mg | ORAL_TABLET | Freq: Every day | ORAL | 1 refills | Status: DC

## 2018-08-17 MED ORDER — BLOOD PRESS MONITOR/M-L CUFF MISC: 1.0000 | Freq: Every morning | 0 refills | Status: AC

## 2018-08-31 ENCOUNTER — Ambulatory Visit: Payer: 59 | Admitting: Internal Medicine

## 2018-09-02 ENCOUNTER — Ambulatory Visit: Payer: 59 | Admitting: Internal Medicine

## 2018-12-07 ENCOUNTER — Encounter: Payer: Self-pay | Admitting: Gynecology

## 2018-12-13 IMAGING — CT CT HEAD W/O CM
4 series · 16 of 47 positions shown, 18 images · non-contrast
Comparison: None.

CLINICAL DATA: 47-year-old female with a history of headache

EXAM:
CT HEAD WITHOUT CONTRAST
TECHNIQUE: Contiguous axial images were obtained from the base of the skull
through the vertex without intravenous contrast.

[Series 3: head wo · axial · 0.46mm/px · z∈[-122,-8]mm · 7 of 31 slices shown, 9 images]
[im 4/31  brain]
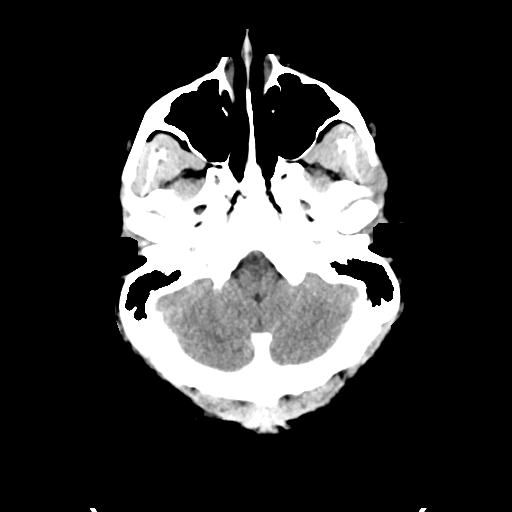
[im 4/31  bone]
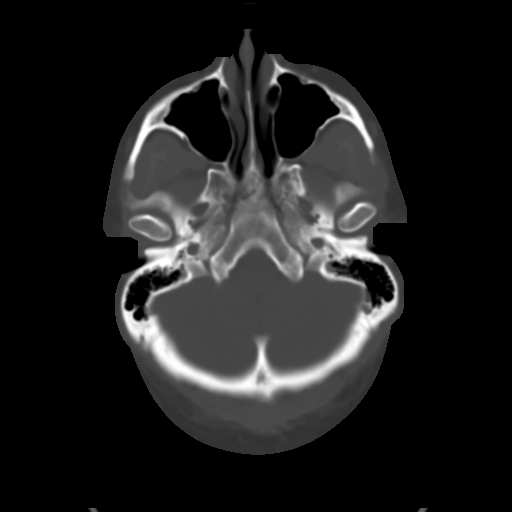
[im 8/31  brain]
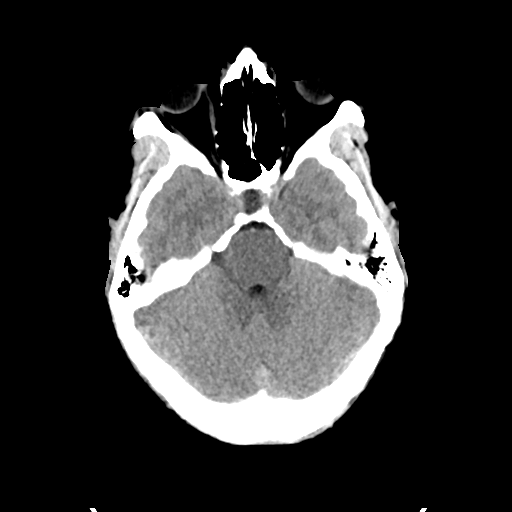
[im 12/31  brain]
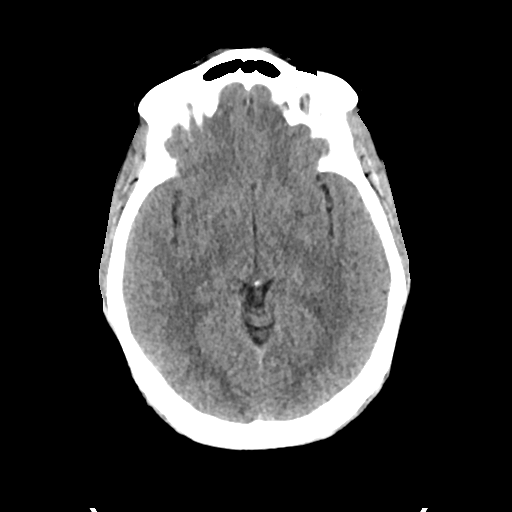
[im 16/31  brain]
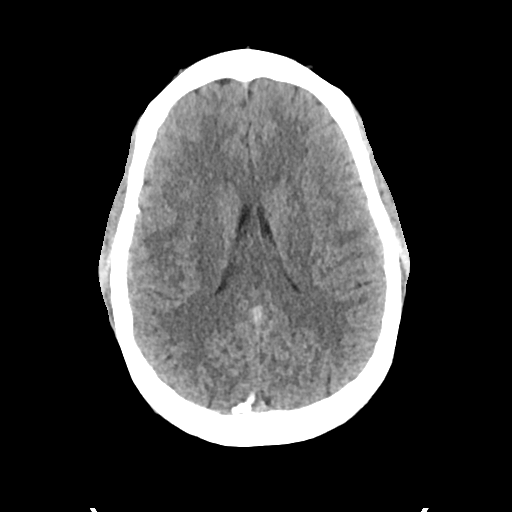
[im 19/31  brain]
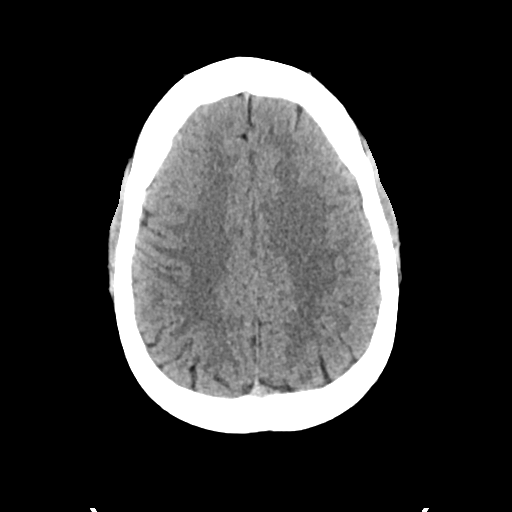
[im 19/31  bone]
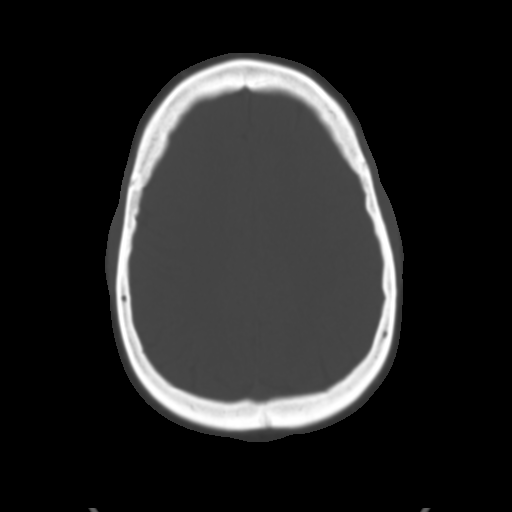
[im 23/31  brain]
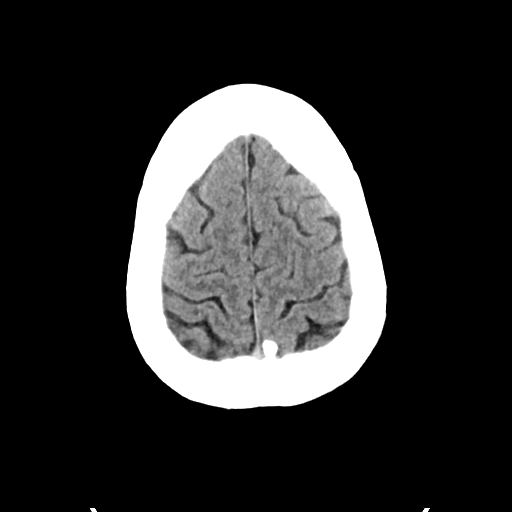
[im 27/31  brain]
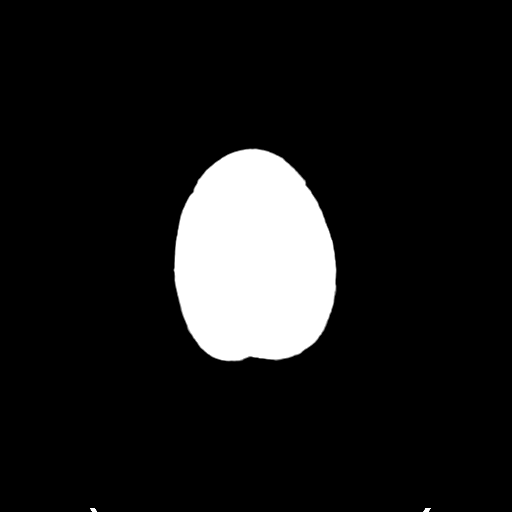

[Series 4: head bone · axial · 0.46mm/px · z∈[-124,-94]mm · 3 of 77 slices shown]
[im 8/77  bone]
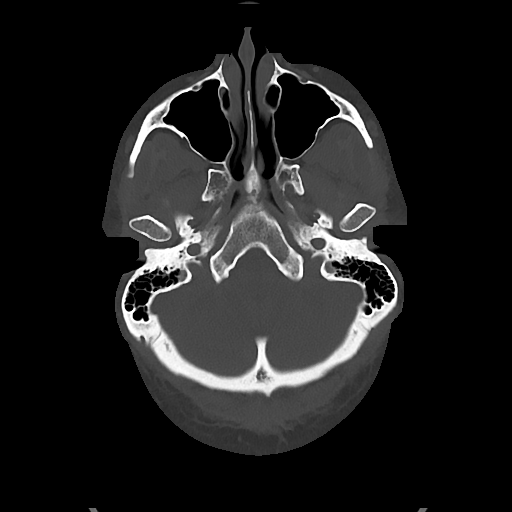
[im 16/77  bone]
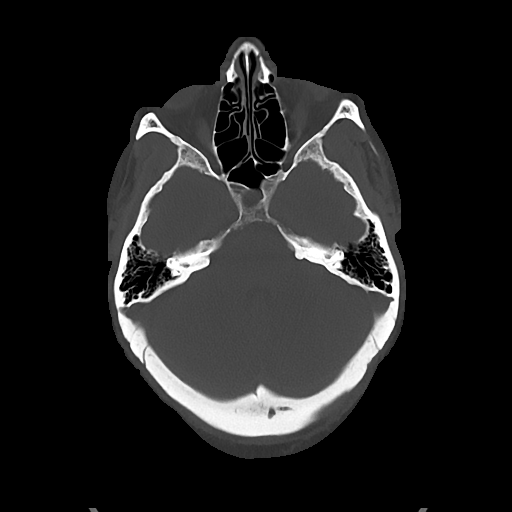
[im 23/77  bone]
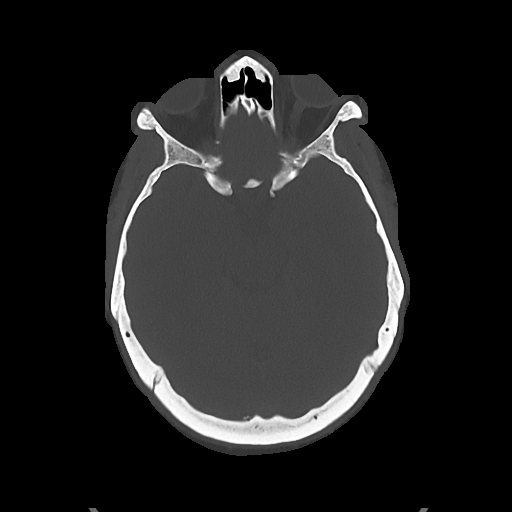

[Series 5: cor soft · coronal · 0.30mm/px · 3 of 72 slices shown]
[im 24/72  brain]
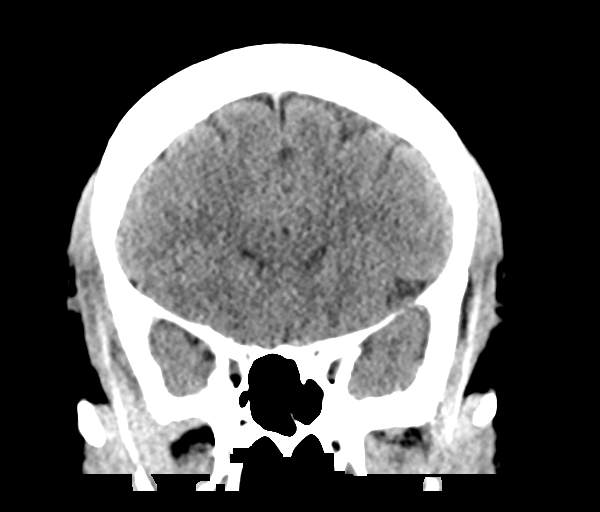
[im 32/72  brain]
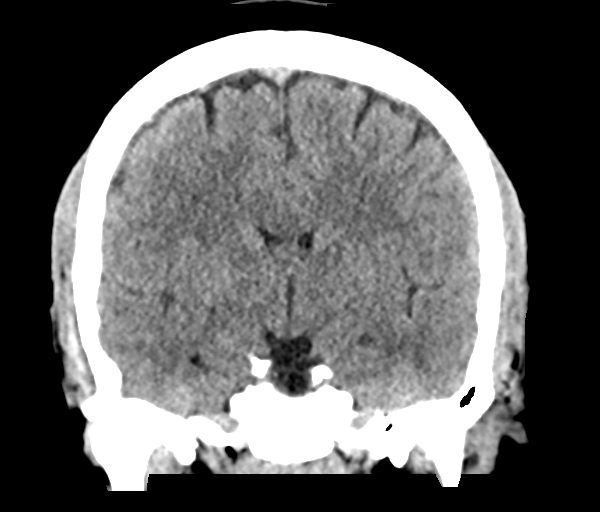
[im 40/72  brain]
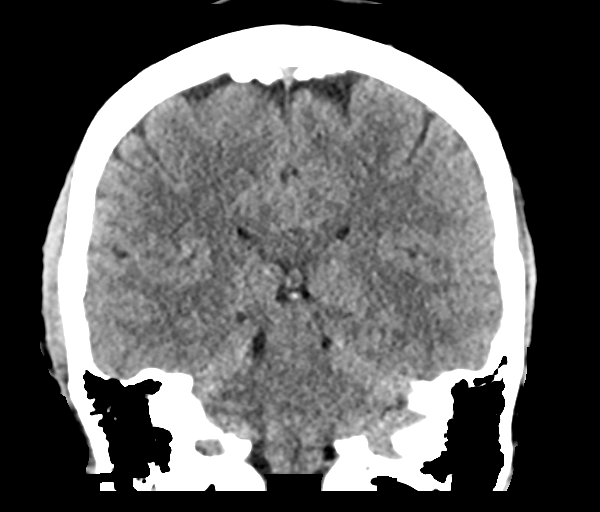

[Series 6: sag soft · sagittal · 0.29mm/px · 3 of 58 slices shown]
[im 20/58  brain]
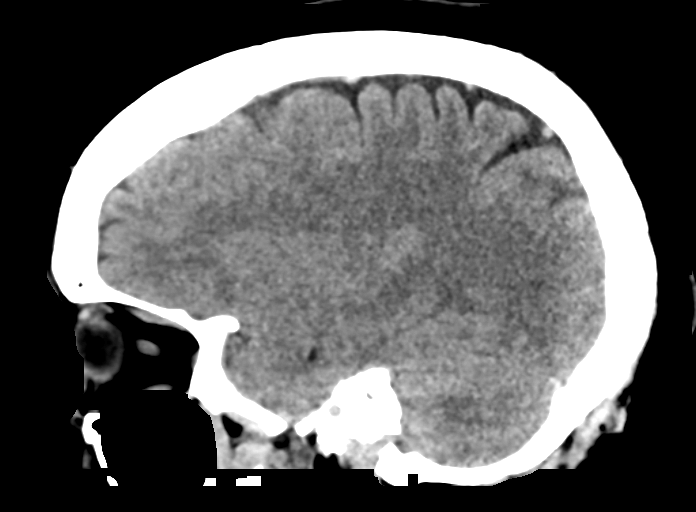
[im 29/58  brain]
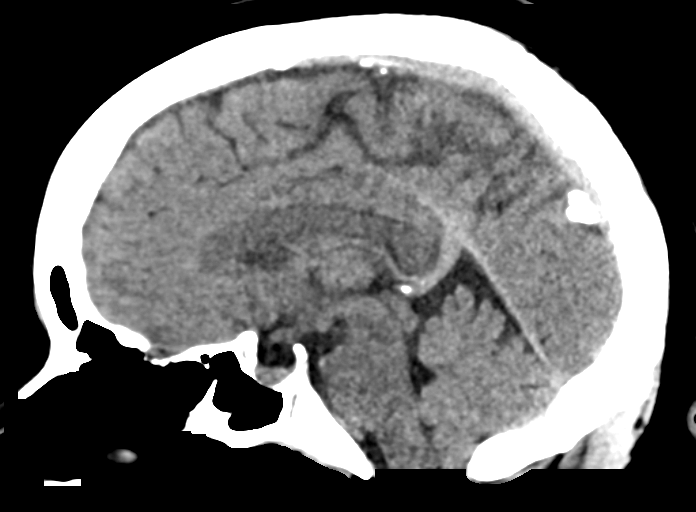
[im 39/58  brain]
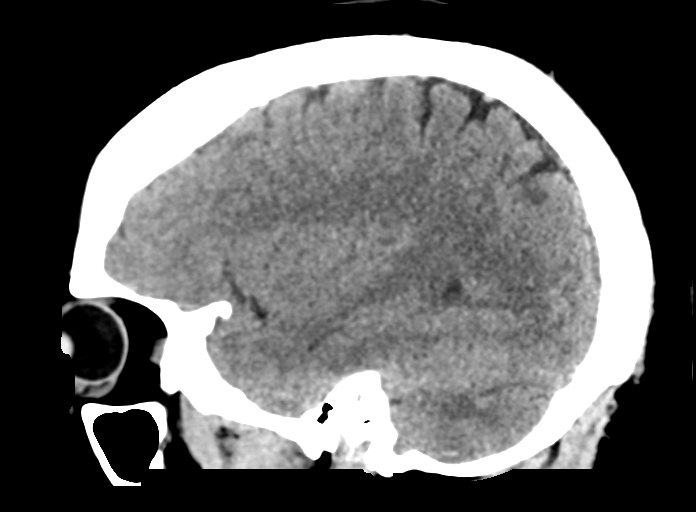

[16 of 47 positions shown; findings below may reference images not displayed]

FINDINGS: Brain: No acute intracranial hemorrhage. No midline shift or mass
effect. Gray-white differentiation maintained. Unremarkable
appearance of the ventricular system.

Vascular: Unremarkable.

Skull: No acute fracture.  No aggressive bone lesion identified.

Sinuses/Orbits: Unremarkable appearance of the orbits. Mastoid air
cells clear. No middle ear effusion. No significant sinus disease.

Other: None
IMPRESSION: Negative head CT

## 2019-01-10 ENCOUNTER — Other Ambulatory Visit: Payer: Self-pay

## 2019-01-10 DIAGNOSIS — Z20822 Contact with and (suspected) exposure to covid-19: Secondary | ICD-10-CM

## 2019-01-11 LAB — NOVEL CORONAVIRUS, NAA: SARS-CoV-2, NAA: NOT DETECTED

## 2019-01-12 ENCOUNTER — Encounter (HOSPITAL_COMMUNITY): Payer: Self-pay

## 2019-01-12 ENCOUNTER — Ambulatory Visit (HOSPITAL_COMMUNITY)
Admission: EM | Admit: 2019-01-12 | Discharge: 2019-01-12 | Disposition: A | Discharge status: Home / Self Care | Payer: 59 | Attending: Urgent Care | Admitting: Urgent Care

## 2019-01-12 ENCOUNTER — Other Ambulatory Visit: Payer: Self-pay

## 2019-01-12 DIAGNOSIS — R0989 Other specified symptoms and signs involving the circulatory and respiratory systems: Secondary | ICD-10-CM

## 2019-01-12 DIAGNOSIS — J069 Acute upper respiratory infection, unspecified: Secondary | ICD-10-CM | POA: Diagnosis not present

## 2019-01-12 DIAGNOSIS — R05 Cough: Secondary | ICD-10-CM

## 2019-01-12 DIAGNOSIS — R059 Cough, unspecified: Secondary | ICD-10-CM

## 2019-01-12 MED ORDER — PSEUDOEPHEDRINE HCL 30 MG PO TABS: 30.0000 mg | ORAL_TABLET | Freq: Three times a day (TID) | ORAL | 0 refills | Status: DC | PRN

## 2019-01-12 MED ORDER — BENZONATATE 100 MG PO CAPS: 100.0000 mg | ORAL_CAPSULE | Freq: Three times a day (TID) | ORAL | 0 refills | Status: DC | PRN

## 2019-01-12 MED ORDER — HYDROCODONE-HOMATROPINE 5-1.5 MG/5ML PO SYRP: 5.0000 mL | ORAL_SOLUTION | Freq: Every evening | ORAL | 0 refills | Status: DC | PRN

## 2019-01-12 NOTE — ED Triage Notes (Signed)
Pt cc cough chills and chest congestion x 1 week. Pt states she had a Covid test Monday and it came back negative.

## 2019-01-12 NOTE — ED Provider Notes (Signed)
MRN: [id number] DOB: 24-Dec-1970  Subjective:   Sherry Sampson is a 48 y.o. female presenting for 5-day history of persistent moderate malaise, dry cough and chest congestion.  Patient has used over-the-counter medications with minimal relief.  She worsened over the weekend on Sunday and decided to get testing for Covid.  Her results were negative.  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  Denies hx of allergies, asthma. Denies smoking cigarettes.   Takes amlodipine at 5mg .    No Known Allergies  Past Medical History:  Diagnosis Date  . Heart murmur   . Hypertension    no meds.. pt states long time ago  . Leiomyoma 2013   11x44mm, 10x30mm     Past Surgical History:  Procedure Laterality Date  . BACK SURGERY    . BREAST SURGERY     BREAST REDUCTION  . DILITATION & CURRETTAGE/HYSTROSCOPY WITH NOVASURE ABLATION Bilateral 12/05/2014   Procedure: DILATATION & CURETTAGE/HYSTEROSCOPY WITH NOVASURE ABLATION;  Surgeon: Anastasio Auerbach, MD;  Location: [facility];  Service: Gynecology;  Laterality: Bilateral;  . HYSTEROSCOPY     D & C  . LAPAROSCOPIC BILATERAL SALPINGECTOMY Bilateral 12/05/2014   Procedure: LAPAROSCOPIC BILATERAL SALPINGECTOMY AND FULGURATION OF ENDOMETRIOSIS;  Surgeon: Anastasio Auerbach, MD;  Location: [facility];  Service: Gynecology;  Laterality: Bilateral;    Review of Systems  Constitutional: Positive for malaise/fatigue. Negative for fever.  HENT: Negative for congestion, ear pain, sinus pain and sore throat.   Eyes: Negative for discharge and redness.  Respiratory: Positive for cough and shortness of breath. Negative for hemoptysis and wheezing.   Cardiovascular: Negative for chest pain.  Gastrointestinal: Negative for abdominal pain, diarrhea, nausea and vomiting.  Genitourinary: Negative for dysuria, flank pain and hematuria.  Musculoskeletal: Positive for myalgias.  Skin: Negative for rash.  Neurological: Negative  for dizziness, weakness and headaches.  Psychiatric/Behavioral: Negative for depression and substance abuse.    Objective:   Vitals: BP 130/72 (BP Location: Right Arm)   Pulse 75   Temp 98.8 F (37.1 C) (Oral)   Resp 18   Wt 195 lb (88.5 kg)   LMP 01/05/2019   SpO2 100%   BMI 33.47 kg/m   Physical Exam Constitutional:      General: She is not in acute distress.    Appearance: Normal appearance. She is well-developed. She is not ill-appearing, toxic-appearing or diaphoretic.  HENT:     Head: Normocephalic and atraumatic.     Right Ear: Tympanic membrane and ear canal normal. No drainage or tenderness. No middle ear effusion. Tympanic membrane is not erythematous.     Left Ear: Tympanic membrane and ear canal normal. No drainage or tenderness.  No middle ear effusion. Tympanic membrane is not erythematous.     Nose: Nose normal. No congestion or rhinorrhea.     Mouth/Throat:     Mouth: Mucous membranes are moist. No oral lesions.     Pharynx: Oropharynx is clear. No pharyngeal swelling, oropharyngeal exudate, posterior oropharyngeal erythema or uvula swelling.     Tonsils: No tonsillar exudate or tonsillar abscesses.  Eyes:     Extraocular Movements: Extraocular movements intact.     Right eye: Normal extraocular motion.     Left eye: Normal extraocular motion.     Conjunctiva/sclera: Conjunctivae normal.     Pupils: Pupils are equal, round, and reactive to light.  Neck:     Musculoskeletal: Normal range of motion and neck supple.  Cardiovascular:     Rate and Rhythm: Normal rate and regular rhythm.     Pulses: Normal pulses.     Heart sounds: Normal heart sounds. No murmur. No friction rub. No gallop.   Pulmonary:     Effort: Pulmonary effort is normal. No respiratory distress.     Breath sounds: Normal breath sounds. No stridor. No wheezing, rhonchi or rales.  Lymphadenopathy:     Cervical: No cervical adenopathy.  Skin:    General: Skin is warm and dry.      Findings: No rash.  Neurological:     General: No focal deficit present.     Mental Status: She is alert and oriented to person, place, and time.  Psychiatric:        Mood and Affect: Mood normal.        Behavior: Behavior normal.        Thought Content: Thought content normal.      Assessment and Plan :   1. Viral URI with cough   2. Cough   3. Chest congestion     We will manage for viral illness such as upper respiratory infection, rhinosinusitis. Advised supportive care, offered symptomatic relief.  Patient was agreeable to trying hydrocodone cough syrup given severity of her cough.   database for controlled substance reviewed and patient is very low risk.  Counseled patient on potential for adverse effects with medications prescribed/recommended today, ER and return-to-clinic precautions discussed, patient verbalized understanding.     Jaynee Eagles, [place] 01/12/19 1845

## 2019-01-12 NOTE — Discharge Instructions (Addendum)
We will manage this as a viral syndrome. For sore throat or cough try using a honey-based tea. Use 3 teaspoons of honey with juice squeezed from half lemon. Place shaved pieces of ginger into 1/2-1 cup of water and warm over stove top. Then mix the ingredients and repeat every 4 hours as needed. Please take Tylenol 500mg  every 6 hours. Hydrate very well with at least 2 liters of water. Eat light meals such as soups to replenish electrolytes and soft fruits, veggies. Start an antihistamine like Zyrtec, Allegra or Claritin for postnasal drainage, sinus congestion.  You can take this together with pseudoephedrine (Sudafed) at a dose of 30 mg 3 times a day or twice daily as needed for the same kind of congestion.

## 2019-01-20 ENCOUNTER — Other Ambulatory Visit: Payer: Self-pay

## 2019-01-21 ENCOUNTER — Encounter: Payer: Self-pay | Admitting: Gynecology

## 2019-01-21 ENCOUNTER — Ambulatory Visit (INDEPENDENT_AMBULATORY_CARE_PROVIDER_SITE_OTHER): Payer: 59 | Admitting: Gynecology

## 2019-01-21 VITALS — BP 124/80 | Ht 65.0 in | Wt 203.0 lb

## 2019-01-21 DIAGNOSIS — Z01419 Encounter for gynecological examination (general) (routine) without abnormal findings: Secondary | ICD-10-CM | POA: Diagnosis not present

## 2019-01-21 DIAGNOSIS — Z1151 Encounter for screening for human papillomavirus (HPV): Secondary | ICD-10-CM | POA: Diagnosis not present

## 2019-01-21 NOTE — Addendum Note (Signed)
Addended by: Nelva Nay on: 01/21/2019 03:09 PM   Modules accepted: Orders

## 2019-01-21 NOTE — Patient Instructions (Signed)
Schedule your mammogram.  Follow-up in 1 year for annual exam. 

## 2019-01-21 NOTE — Progress Notes (Signed)
    Sherry Sampson 06/14/1970 [id number]        48 y.o.  G1P1 for annual gynecologic exam.  Without gynecologic complaints  Past medical history,surgical history, problem list, medications, allergies, family history and social history were all reviewed and documented as reviewed in the EPIC chart.  ROS:  Performed with pertinent positives and negatives included in the history, assessment and plan.   Additional significant findings : None   Exam: [name] assistant Vitals:   01/21/19 1424  BP: 124/80  Weight: 203 lb (92.1 kg)  Height: 5\' 5"  (1.651 m)   Body mass index is 33.78 kg/m.  General appearance:  Normal affect, orientation and appearance. Skin: Grossly normal HEENT: Without gross lesions.  No cervical or supraclavicular adenopathy. Thyroid normal.  Lungs:  Clear without wheezing, rales or rhonchi Cardiac: RR, without RMG Abdominal:  Soft, nontender, without masses, guarding, rebound, organomegaly or hernia Breasts:  Examined lying and sitting without masses, retractions, discharge or axillary adenopathy. Pelvic:  Ext, BUS, Vagina: Normal  Cervix: Normal.  Pap smear/HPV  Uterus: Anteverted, normal size, shape and contour, midline and mobile nontender   Adnexa: Without masses or tenderness    Anus and perineum: Normal   Rectovaginal: Normal sphincter tone without palpated masses or tenderness.    Assessment/Plan:  48 y.o. G1P1 female for annual gynecologic exam.  With regular menses, BTL  1. Status post endometrial ablation in the past.  Having light regular menses. 2. Mammography a number of years ago.  I have asked her repeatedly to schedule a screening mammography.  I again strongly recommended a screening mammography.  Most common cancer in women stressed.  Patient agrees to call and schedule.  Breast exam normal today. 3. Pap smear/HPV 04/2014.  Pap smear/HPV today.  No history of abnormal Pap smears previously. 4. Health maintenance.  Patient has routine blood  work done elsewhere.  Follow-up 1 year, sooner as needed.   Anastasio Auerbach MD, 2:40 PM 01/21/2019

## 2019-01-24 LAB — PAP IG AND HPV HIGH-RISK: HPV DNA High Risk: NOT DETECTED

## 2019-01-25 ENCOUNTER — Telehealth: Payer: Self-pay | Admitting: *Deleted

## 2019-01-25 MED ORDER — METRONIDAZOLE 500 MG PO TABS: 500.0000 mg | ORAL_TABLET | Freq: Two times a day (BID) | ORAL | 0 refills | Status: AC

## 2019-01-25 NOTE — Telephone Encounter (Signed)
Refill

## 2019-02-21 ENCOUNTER — Telehealth: Payer: Self-pay | Admitting: Gynecology

## 2019-02-21 NOTE — Telephone Encounter (Signed)
On-call note 02/19/2019: Patient calls with vaginal itching and slight discharge.  Has tried OTC Monistat x2 days without relief.  No odor.  No urinary tract symptoms.  Recommend Diflucan 150 mg x 1 dose called to her pharmacy.  Office visit if symptoms continue.

## 2019-02-22 ENCOUNTER — Telehealth: Payer: Self-pay

## 2019-02-22 MED ORDER — FLUCONAZOLE 150 MG PO TABS: 150.0000 mg | ORAL_TABLET | Freq: Once | ORAL | 0 refills | Status: AC

## 2019-02-22 NOTE — Telephone Encounter (Signed)
Okay to repeat the Diflucan 150 mg x 1

## 2019-02-22 NOTE — Telephone Encounter (Signed)
Patient informed. Rx sent 

## 2019-02-22 NOTE — Telephone Encounter (Signed)
Patient said over the weekend you prescribed a Diflucan tab for her and you told her to call today and let you know how she is doing.  She said she has no pain or burning anymore but still feels slightly itchy.

## 2019-08-14 ENCOUNTER — Ambulatory Visit (HOSPITAL_COMMUNITY)
Admission: EM | Admit: 2019-08-14 | Discharge: 2019-08-14 | Disposition: A | Discharge status: Home / Self Care | Payer: 59 | Attending: Physician Assistant | Admitting: Physician Assistant

## 2019-08-14 ENCOUNTER — Other Ambulatory Visit: Payer: Self-pay

## 2019-08-14 ENCOUNTER — Encounter (HOSPITAL_COMMUNITY): Payer: Self-pay

## 2019-08-14 DIAGNOSIS — J069 Acute upper respiratory infection, unspecified: Secondary | ICD-10-CM

## 2019-08-14 MED ORDER — CETIRIZINE HCL 10 MG PO TABS: 10.0000 mg | ORAL_TABLET | Freq: Every day | ORAL | 0 refills | Status: DC

## 2019-08-14 MED ORDER — BENZONATATE 200 MG PO CAPS: 200.0000 mg | ORAL_CAPSULE | Freq: Three times a day (TID) | ORAL | 0 refills | Status: DC | PRN

## 2019-08-14 NOTE — ED Provider Notes (Signed)
[place]    CSN: [id number] Arrival date & time: 08/14/19  1129      History   Chief Complaint Chief Complaint  Patient presents with  . Nasal Congestion  . Sore Throat    HPI Sherry Sampson is a 49 y.o. female.   Patient here c/w nasal congestion x 4 days.  [place]  Bridal party sick with similar sx.  She had both doses of COVID vaccine > 2 weeks ago.  She admits nasal congestion, rhinorrhea, sore throat, cough.  Denies f/c, otalgia, n/v/d, abdominal pain, wheezing, SOB.  No advil or tylenol today, she has tried tylenol cold and sinus last night w/o relief.     Past Medical History:  Diagnosis Date  . Heart murmur   . Hypertension    no meds.. pt states long time ago  . Leiomyoma 2013   11x58mm, 10x72mm    Patient Active Problem List   Diagnosis Date Noted  . Snoring 05/21/2018  . Back pain, chronic 12/27/2014  . DDD (degenerative disc disease), lumbar 12/27/2014  . Essential hypertension 12/27/2014  . Heart murmur 12/27/2014  . Obesity, Class I, BMI 30-34.9 05/13/2013    Past Surgical History:  Procedure Laterality Date  . BACK SURGERY    . BREAST SURGERY     BREAST REDUCTION  . DILITATION & CURRETTAGE/HYSTROSCOPY WITH NOVASURE ABLATION Bilateral 12/05/2014   Procedure: DILATATION & CURETTAGE/HYSTEROSCOPY WITH NOVASURE ABLATION;  Surgeon: Anastasio Auerbach, MD;  Location: [facility];  Service: Gynecology;  Laterality: Bilateral;  . HYSTEROSCOPY     D & C  . LAPAROSCOPIC BILATERAL SALPINGECTOMY Bilateral 12/05/2014   Procedure: LAPAROSCOPIC BILATERAL SALPINGECTOMY AND FULGURATION OF ENDOMETRIOSIS;  Surgeon: Anastasio Auerbach, MD;  Location: [facility];  Service: Gynecology;  Laterality: Bilateral;    OB History    Gravida  1   Para  1   Term      Preterm      AB      Living  1     SAB      TAB      Ectopic      Multiple      Live Births               Home Medications    Prior to Admission  medications   Medication Sig Start Date End Date Taking? Authorizing Provider  amLODipine (NORVASC) 10 MG tablet Take 1 tablet (10 mg total) by mouth daily. 08/17/18 08/17/19  Rodriguez-Southworth, Sunday Spillers, PA-C  benzonatate (TESSALON) 200 MG capsule Take 1 capsule (200 mg total) by mouth 3 (three) times daily as needed for cough. 08/14/19   Peri Jefferson, PA-C  Blood Pressure Monitoring (BLOOD PRESS MONITOR/M-L CUFF) MISC 1 Device by Does not apply route every morning. 08/17/18   Minette Brine, FNP  cetirizine (ZYRTEC ALLERGY) 10 MG tablet Take 1 tablet (10 mg total) by mouth daily. 08/14/19   Peri Jefferson, PA-C    Family History Family History  Problem Relation Age of Onset  . Diabetes Maternal Grandmother   . Cancer Paternal Grandmother        COLON  . Hypertension Father   . Heart murmur Father   . Heart murmur Daughter     Social History Social History   Tobacco Use  . Smoking status: Never Smoker  . Smokeless tobacco: Never Used  Substance Use Topics  . Alcohol use: No    Alcohol/week: 0.0 standard drinks  .  Drug use: No     Allergies   Patient has no known allergies.   Review of Systems Review of Systems  Constitutional: Negative for chills, diaphoresis, fatigue and fever.  HENT: Positive for congestion, rhinorrhea and sore throat. Negative for dental problem, drooling, ear pain, nosebleeds, postnasal drip, sinus pressure and sinus pain.   Eyes: Negative for pain and visual disturbance.  Respiratory: Positive for cough. Negative for shortness of breath and wheezing.   Cardiovascular: Negative for chest pain and palpitations.  Gastrointestinal: Negative for abdominal pain, diarrhea, nausea and vomiting.  Genitourinary: Negative for dysuria and hematuria.  Musculoskeletal: Negative for arthralgias and back pain.  Skin: Negative for color change and rash.  Neurological: Negative for dizziness, light-headedness and headaches.  Hematological: Negative for adenopathy.  Does not bruise/bleed easily.  Psychiatric/Behavioral: Negative for confusion and sleep disturbance.  All other systems reviewed and are negative.    Physical Exam Triage Vital Signs ED Triage Vitals  Enc Vitals Group     BP 08/14/19 1214 (!) 147/97     Pulse Rate 08/14/19 1214 75     Resp 08/14/19 1214 18     Temp 08/14/19 1214 98.7 F (37.1 C)     Temp src --      SpO2 08/14/19 1214 98 %     Weight --      Height --      Head Circumference --      Peak Flow --      Pain Score 08/14/19 1213 8     Pain Loc --      Pain Edu? --      Excl. in [place]? --    No data found.  Updated Vital Signs BP (!) 147/97   Pulse 75   Temp 98.7 F (37.1 C)   Resp 18   LMP 07/29/2019   SpO2 98%   Visual Acuity Right Eye Distance:   Left Eye Distance:   Bilateral Distance:    Right Eye Near:   Left Eye Near:    Bilateral Near:     Physical Exam Vitals and nursing note reviewed.  Constitutional:      General: She is not in acute distress.    Appearance: Normal appearance. She is well-developed. She is obese. She is not ill-appearing or toxic-appearing.  HENT:     Head: Normocephalic and atraumatic.     Right Ear: Tympanic membrane and ear canal normal. No swelling or tenderness. No middle ear effusion. Tympanic membrane is not injected, scarred, perforated, retracted or bulging.     Left Ear: Tympanic membrane and ear canal normal. No swelling or tenderness.  No middle ear effusion. Tympanic membrane is not injected, scarred, perforated, retracted or bulging.     Nose: Mucosal edema, congestion and rhinorrhea present. Rhinorrhea is clear.     Right Turbinates: Swollen. Not enlarged.     Left Turbinates: Swollen. Not enlarged.     Right Sinus: No maxillary sinus tenderness or frontal sinus tenderness.     Left Sinus: No maxillary sinus tenderness or frontal sinus tenderness.     Mouth/Throat:     Mouth: Mucous membranes are moist.     Pharynx: Oropharynx is clear. No pharyngeal  swelling, oropharyngeal exudate, posterior oropharyngeal erythema or uvula swelling.     Tonsils: No tonsillar exudate or tonsillar abscesses. 0 on the right. 0 on the left.  Eyes:     Conjunctiva/sclera: Conjunctivae normal.  Cardiovascular:     Rate and Rhythm: Normal rate  and regular rhythm.     Heart sounds: No murmur.  Pulmonary:     Effort: Pulmonary effort is normal. No respiratory distress.     Breath sounds: Normal breath sounds. No wheezing, rhonchi or rales.  Musculoskeletal:        General: Normal range of motion.     Cervical back: Neck supple.  Skin:    General: Skin is warm and dry.     Capillary Refill: Capillary refill takes less than 2 seconds.  Neurological:     General: No focal deficit present.     Mental Status: She is alert and oriented to person, place, and time.  Psychiatric:        Mood and Affect: Mood normal.        Behavior: Behavior normal.      UC Treatments / Results  Labs (all labs ordered are listed, but only abnormal results are displayed) Labs Reviewed - No data to display  EKG   Radiology No results found.  Procedures Procedures (including critical care time)  Medications Ordered in UC Medications - No data to display  Initial Impression / Assessment and Plan / UC Course  I have reviewed the triage vital signs and the nursing notes.  Pertinent labs & imaging results that were available during my care of the patient were reviewed by me and considered in my medical decision making (see chart for details).     Declined COVID test - gets tested at work frequently.  Final Clinical Impressions(s) / UC Diagnoses   Final diagnoses:  Viral upper respiratory tract infection     Discharge Instructions     Take zyrtec daily. Take medication as prescribed. Follow up with PCP if no improvement in 1 week.    ED Prescriptions    Medication Sig Dispense Auth. Provider   benzonatate (TESSALON) 200 MG capsule Take 1 capsule (200  mg total) by mouth 3 (three) times daily as needed for cough. 20 capsule Peri Jefferson, PA-C   cetirizine (ZYRTEC ALLERGY) 10 MG tablet Take 1 tablet (10 mg total) by mouth daily. 20 tablet Peri Jefferson, PA-C     PDMP not reviewed this encounter.   Peri Jefferson, PA-C 08/14/19 1240

## 2019-08-14 NOTE — Discharge Instructions (Signed)
Take zyrtec daily. Take medication as prescribed. Follow up with PCP if no improvement in 1 week.

## 2019-08-14 NOTE — ED Triage Notes (Signed)
Pt presents with c/o nasal congestion and sore throat that began a few days ago. Pt states she has had both covid vaccines

## 2019-08-25 ENCOUNTER — Other Ambulatory Visit: Payer: Self-pay | Admitting: Internal Medicine

## 2019-08-25 ENCOUNTER — Telehealth: Payer: Self-pay

## 2019-08-25 DIAGNOSIS — I10 Essential (primary) hypertension: Secondary | ICD-10-CM

## 2019-08-25 NOTE — Telephone Encounter (Signed)
PT LVM WANTING APPT ATT TO CONTACT PT TO [facility] NO ANS LVM THAT APPT SCHEDULED FOR 6/22 IF INCONVENIENT CALL OFC TO [facility]

## 2019-09-06 ENCOUNTER — Ambulatory Visit: Payer: 59 | Admitting: Nurse Practitioner

## 2019-09-15 ENCOUNTER — Other Ambulatory Visit: Payer: Self-pay

## 2019-09-15 ENCOUNTER — Ambulatory Visit: Payer: 59 | Admitting: Nurse Practitioner

## 2019-09-15 ENCOUNTER — Encounter: Payer: Self-pay | Admitting: Nurse Practitioner

## 2019-09-15 VITALS — BP 136/88 | HR 102 | Temp 98.5°F | Wt 214.8 lb

## 2019-09-15 DIAGNOSIS — Z6835 Body mass index (BMI) 35.0-35.9, adult: Secondary | ICD-10-CM

## 2019-09-15 DIAGNOSIS — R2242 Localized swelling, mass and lump, left lower limb: Secondary | ICD-10-CM | POA: Diagnosis not present

## 2019-09-15 DIAGNOSIS — I10 Essential (primary) hypertension: Secondary | ICD-10-CM

## 2019-09-15 MED ORDER — HYDROCHLOROTHIAZIDE 12.5 MG PO TABS: 12.5000 mg | ORAL_TABLET | Freq: Every day | ORAL | 2 refills | Status: DC

## 2019-09-15 NOTE — Patient Instructions (Signed)
Edema  Edema is when you have too much fluid in your body or under your skin. Edema may make your legs, feet, and ankles swell up. Swelling is also common in looser tissues, like around your eyes. This is a common condition. It gets more common as you get older. There are many possible causes of edema. Eating too much salt (sodium) and being on your feet or sitting for a long time can cause edema in your legs, feet, and ankles. Hot weather may make edema worse. Edema is usually painless. Your skin may look swollen or shiny. Follow these instructions at home:  Keep the swollen body part raised (elevated) above the level of your heart when you are sitting or lying down.  Do not sit still or stand for a long time.  Do not wear tight clothes. Do not wear garters on your upper legs.  Exercise your legs. This can help the swelling go down.  Wear elastic bandages or support stockings as told by your doctor.  Eat a low-salt (low-sodium) diet to reduce fluid as told by your doctor.  Depending on the cause of your swelling, you may need to limit how much fluid you drink (fluid restriction).  Take over-the-counter and prescription medicines only as told by your doctor. Contact a doctor if:  Treatment is not working.  You have heart, liver, or kidney disease and have symptoms of edema.  You have sudden and unexplained weight gain. Get help right away if:  You have shortness of breath or chest pain.  You cannot breathe when you lie down.  You have pain, redness, or warmth in the swollen areas.  You have heart, liver, or kidney disease and get edema all of a sudden.  You have a fever and your symptoms get worse all of a sudden. Summary  Edema is when you have too much fluid in your body or under your skin.  Edema may make your legs, feet, and ankles swell up. Swelling is also common in looser tissues, like around your eyes.  Raise (elevate) the swollen body part above the level of your  heart when you are sitting or lying down.  Follow your doctor's instructions about diet and how much fluid you can drink (fluid restriction). This information is not intended to replace advice given to you by your health care provider. Make sure you discuss any questions you have with your health care provider. Document Revised: 03/06/2017 Document Reviewed: 03/21/2016 Elsevier Patient Education  2020 Elsevier Inc.  

## 2019-09-15 NOTE — Progress Notes (Signed)
This visit occurred during the SARS-CoV-2 public health emergency.  Safety protocols were in place, including screening questions prior to the visit, additional usage of staff PPE, and extensive cleaning of exam room while observing appropriate contact time as indicated for disinfecting solutions.  Subjective:     Patient ID: Sherry Sampson , female    DOB: 08/19/1970 , 49 y.o.   MRN: [id number]   Chief Complaint  Patient presents with  . Hypertension    HPI  She is starting to walk   Wt Readings from Last 3 Encounters: 09/15/19 : 214 lb 12.8 oz (97.4 kg) 01/21/19 : 203 lb (92.1 kg) 01/12/19 : 195 lb (88.5 kg)  She does wear compression socks   Hypertension This is a chronic problem. The current episode started more than 1 year ago. The problem has been gradually worsening since onset. The problem is controlled. Pertinent negatives include no anxiety, chest pain, headaches, palpitations or shortness of breath. Risk factors for coronary artery disease include sedentary lifestyle and obesity. Past treatments include calcium channel blockers. There are no compliance problems.  There is no history of angina. There is no history of chronic renal disease.     Past Medical History:  Diagnosis Date  . Heart murmur   . Hypertension    no meds.. pt states long time ago  . Leiomyoma 2013   11x34m, 10x768m    Family History  Problem Relation Age of Onset  . Diabetes Maternal Grandmother   . Cancer Paternal Grandmother        COLON  . Hypertension Father   . Heart murmur Father   . Heart murmur Daughter      Current Outpatient Medications:  .  amLODipine (NORVASC) 10 MG tablet, Take 1 tablet (10 mg total) by mouth daily., Disp: 90 tablet, Rfl: 1 .  Blood Pressure Monitoring (BLOOD PRESS MONITOR/M-L CUFF) MISC, 1 Device by Does not apply route every morning., Disp: 1 each, Rfl: 0 .  benzonatate (TESSALON) 200 MG capsule, Take 1 capsule (200 mg total) by mouth 3 (three) times  daily as needed for cough. (Patient not taking: Reported on 09/15/2019), Disp: 20 capsule, Rfl: 0   No Known Allergies   Review of Systems  Constitutional: Negative.  Negative for fatigue.  Eyes: Negative for visual disturbance.  Respiratory: Negative.  Negative for shortness of breath.   Cardiovascular: Negative.  Negative for chest pain, palpitations and leg swelling.  Gastrointestinal: Negative.   Endocrine: Negative.   Musculoskeletal: Negative.   Skin: Negative.   Neurological: Negative for dizziness, weakness and headaches.  Psychiatric/Behavioral: Negative for confusion. The patient is not nervous/anxious.      Today's Vitals   09/15/19 1553  BP: 136/88  Pulse: (!) 102  Temp: 98.5 F (36.9 C)  Weight: 214 lb 12.8 oz (97.4 kg)  PainSc: 0-No pain   Body mass index is 35.74 kg/m.   Objective:  Physical Exam Vitals reviewed.  Constitutional:      General: She is not in acute distress.    Appearance: Normal appearance. She is well-developed. She is obese.  HENT:     Head: Normocephalic and atraumatic.  Eyes:     Pupils: Pupils are equal, round, and reactive to light.  Cardiovascular:     Rate and Rhythm: Normal rate and regular rhythm.     Pulses: Normal pulses.     Heart sounds: Normal heart sounds. No murmur heard.   Pulmonary:     Effort: Pulmonary effort is  normal. No respiratory distress.     Breath sounds: Normal breath sounds.  Musculoskeletal:        General: Normal range of motion.  Skin:    General: Skin is warm and dry.     Capillary Refill: Capillary refill takes less than 2 seconds.  Neurological:     General: No focal deficit present.     Mental Status: She is alert and oriented to person, place, and time.     Cranial Nerves: No cranial nerve deficit.  Psychiatric:        Mood and Affect: Mood normal.        Behavior: Behavior normal.        Thought Content: Thought content normal.        Judgment: Judgment normal.         Assessment And  Plan:     1. Essential hypertension  Chronic, blood pressure is fairly controlled  Will restart HCTZ, advised to increase water intake and potassium intake - hydrochlorothiazide (HYDRODIURIL) 12.5 MG tablet; Take 1 tablet (12.5 mg total) by mouth daily.  Dispense: 30 tablet; Refill: 2 - BMP8+eGFR  2. Localized swelling of left lower extremity  Encouraged to elevate feet when possible  Take HCTZ regularly  3. Class 2 severe obesity due to excess calories with serious comorbidity and body mass index (BMI) of 35.0 to 35.9 in adult [facility])  Chronic  Discussed healthy diet and regular exercise options   Encouraged to exercise at least 150 minutes per week with 2 days of strength training  Increasing exercise will also help her blood pressure and swelling to her feet     Minette Brine, FNP    THE PATIENT IS ENCOURAGED TO PRACTICE SOCIAL DISTANCING DUE TO THE COVID-19 PANDEMIC.

## 2019-09-16 LAB — BMP8+EGFR
BUN/Creatinine Ratio: 16 (ref 9–23)
BUN: 14 mg/dL (ref 6–24)
CO2: 23 mmol/L (ref 20–29)
Calcium: 9.6 mg/dL (ref 8.7–10.2)
Chloride: 103 mmol/L (ref 96–106)
Creatinine, Ser: 0.85 mg/dL (ref 0.57–1.00)
GFR calc Af Amer: 94 mL/min/{1.73_m2} (ref >59)
GFR calc non Af Amer: 82 mL/min/{1.73_m2} (ref >59)
Glucose: 72 mg/dL (ref 65–99)
Potassium: 4.1 mmol/L (ref 3.5–5.2)
Sodium: 139 mmol/L (ref 134–144)

## 2019-09-17 ENCOUNTER — Other Ambulatory Visit: Payer: Self-pay | Admitting: Internal Medicine

## 2019-09-17 DIAGNOSIS — I10 Essential (primary) hypertension: Secondary | ICD-10-CM

## 2019-09-21 ENCOUNTER — Encounter: Payer: Self-pay | Admitting: Nurse Practitioner

## 2019-09-28 NOTE — Progress Notes (Signed)
This visit occurred during the SARS-CoV-2 public health emergency.  Safety protocols were in place, including screening questions prior to the visit, additional usage of staff PPE, and extensive cleaning of exam room while observing appropriate contact time as indicated for disinfecting solutions.  Subjective:     Patient ID: Sherry Sampson , female    DOB: [birth year] , 49 y.o.   MRN: [id number]   Chief Complaint  Patient presents with  . Leg Swelling    HPI  Patient is here today for follow up on edema in her right ankle and foot. Today it looks better than the last visit, she has cut back on foods with salt, she drinks maybe 2 water bottles a day.   Wt Readings from Last 3 Encounters: 09/29/19 : 214 lb 12.8 oz (97.4 kg) 09/15/19 : 214 lb 12.8 oz (97.4 kg) 01/21/19 : 203 lb (92.1 kg)      Past Medical History:  Diagnosis Date  . Heart murmur   . Hypertension    no meds.. pt states long time ago  . Leiomyoma 2013   11x73mm, 10x37mm     Family History  Problem Relation Age of Onset  . Diabetes Maternal Grandmother   . Cancer Paternal Grandmother        COLON  . Hypertension Father   . Heart murmur Father   . Heart murmur Daughter      Current Outpatient Medications:  .  amLODipine (NORVASC) 10 MG tablet, TAKE 1 TABLET BY MOUTH EVERY DAY, Disp: 90 tablet, Rfl: 1 .  benzonatate (TESSALON) 200 MG capsule, Take 1 capsule (200 mg total) by mouth 3 (three) times daily as needed for cough., Disp: 20 capsule, Rfl: 0 .  Blood Pressure Monitoring (BLOOD PRESS MONITOR/M-L CUFF) MISC, 1 Device by Does not apply route every morning., Disp: 1 each, Rfl: 0 .  hydrochlorothiazide (HYDRODIURIL) 12.5 MG tablet, Take 1 tablet (12.5 mg total) by mouth daily., Disp: 30 tablet, Rfl: 2 .  Naltrexone-buPROPion HCl ER 8-90 MG TB12, Start 1 tablet every morning for 7 days, then 1 tablet twice daily for 7 days, then 2 tablets every morning and one in the evening, Disp: 120 tablet, Rfl: 1   No  Known Allergies    Review of Systems  Constitutional: Negative.   Eyes: Negative.   Respiratory: Negative.   Cardiovascular: Negative.   Musculoskeletal: Negative.   Skin: Negative.   Neurological: Negative for dizziness, light-headedness and headaches.  Psychiatric/Behavioral: Negative.      Today's Vitals   09/29/19 0838  BP: 126/80  Pulse: 62  Temp: 98.3 F (36.8 C)  TempSrc: Oral  Weight: 214 lb 12.8 oz (97.4 kg)  Height: 5\' 5"  (1.651 m)  PainSc: 0-No pain   Body mass index is 35.74 kg/m.   Objective:  Physical Exam Vitals reviewed.  Constitutional:      General: She is not in acute distress.    Appearance: Normal appearance. She is obese.  Cardiovascular:     Rate and Rhythm: Normal rate and regular rhythm.     Pulses: Normal pulses.     Heart sounds: Normal heart sounds.  Pulmonary:     Effort: Pulmonary effort is normal.     Breath sounds: Normal breath sounds.  Musculoskeletal:     Right lower leg: No edema.     Left lower leg: No edema.  Skin:    General: Skin is warm and dry.  Neurological:     General: No focal deficit present.  Mental Status: She is alert and oriented to person, place, and time.  Psychiatric:        Mood and Affect: Mood normal.        Behavior: Behavior normal.        Thought Content: Thought content normal.        Judgment: Judgment normal.         Assessment And Plan:      1. Ankle swelling, left  This has improved.   Continue with limiting intake of salt.  2. Essential hypertension Blood pressure looks good continue with medication as directed, if swelling continues will consider decreasing dose of amlodipine   3. Class 2 severe obesity due to excess calories with serious comorbidity and body mass index (BMI) of 35.0 to 35.9 in adult (HCC)  ASK- Given permission to discuss weight today  ASSESS - Body mass index is 35.74 kg/m., well tolerated, WAIST CIRCUMFERENCE waist 54  ADVISE  - on risk of  cardiovascular disease currently has a diagnosis of hypertension, on medications.  AGREE - to increase her water intake to at least 4 bottles of water a day, and walking 1 mile 2 days a week Goal is 5-7 pounds by next visit  ASSIST - given portion control handout, feels barriers have been water and exercising also recommended to log food. Gave patient information on different weight loss medication. Patient would like to try Contrave information about the medication has been given, and a sample. Discussed side effects and she is to contact office if any side effects - Naltrexone-buPROPion HCl ER 8-90 MG TB12; Start 1 tablet every morning for 7 days, then 1 tablet twice daily for 7 days, then 2 tablets every morning and one in the evening  Dispense: 120 tablet; Refill: 1    Patient was given opportunity to ask questions. Patient verbalized understanding of the plan and was able to repeat key elements of the plan. All questions were answered to their satisfaction.  Minette Brine, FNP   I, Minette Brine, FNP, have reviewed all documentation for this visit. The documentation on 09/29/19 for the exam, diagnosis, procedures, and orders are all accurate and complete.  THE PATIENT IS ENCOURAGED TO PRACTICE SOCIAL DISTANCING DUE TO THE COVID-19 PANDEMIC.

## 2019-09-29 ENCOUNTER — Other Ambulatory Visit: Payer: Self-pay

## 2019-09-29 ENCOUNTER — Encounter: Payer: Self-pay | Admitting: Nurse Practitioner

## 2019-09-29 ENCOUNTER — Ambulatory Visit: Payer: 59 | Admitting: Nurse Practitioner

## 2019-09-29 VITALS — BP 126/80 | HR 62 | Temp 98.3°F | Ht 65.0 in | Wt 214.8 lb

## 2019-09-29 DIAGNOSIS — I10 Essential (primary) hypertension: Secondary | ICD-10-CM

## 2019-09-29 DIAGNOSIS — M25472 Effusion, left ankle: Secondary | ICD-10-CM | POA: Diagnosis not present

## 2019-09-29 DIAGNOSIS — Z6835 Body mass index (BMI) 35.0-35.9, adult: Secondary | ICD-10-CM | POA: Diagnosis not present

## 2019-09-29 MED ORDER — HYDROCHLOROTHIAZIDE 12.5 MG PO TABS: 12.5000 mg | ORAL_TABLET | Freq: Every day | ORAL | 1 refills | Status: DC

## 2019-09-29 MED ORDER — NALTREXONE-BUPROPION HCL ER 8-90 MG PO TB12: ORAL_TABLET | ORAL | 1 refills | Status: DC

## 2019-09-29 NOTE — Patient Instructions (Signed)

## 2019-10-08 ENCOUNTER — Other Ambulatory Visit: Payer: Self-pay | Admitting: Internal Medicine

## 2019-10-08 DIAGNOSIS — I10 Essential (primary) hypertension: Secondary | ICD-10-CM

## 2019-11-30 ENCOUNTER — Ambulatory Visit: Payer: 59 | Admitting: Nurse Practitioner

## 2020-01-08 ENCOUNTER — Other Ambulatory Visit: Payer: Self-pay | Admitting: Nurse Practitioner

## 2020-01-08 DIAGNOSIS — I10 Essential (primary) hypertension: Secondary | ICD-10-CM

## 2020-01-16 ENCOUNTER — Encounter: Payer: Self-pay | Admitting: Nurse Practitioner

## 2020-01-16 ENCOUNTER — Other Ambulatory Visit: Payer: Self-pay

## 2020-01-16 ENCOUNTER — Ambulatory Visit: Payer: 59 | Admitting: Nurse Practitioner

## 2020-01-16 VITALS — BP 132/80 | HR 68 | Temp 98.1°F | Ht 64.4 in | Wt 213.6 lb

## 2020-01-16 DIAGNOSIS — I10 Essential (primary) hypertension: Secondary | ICD-10-CM | POA: Diagnosis not present

## 2020-01-16 MED ORDER — AMLODIPINE BESYLATE 10 MG PO TABS: 10.0000 mg | ORAL_TABLET | Freq: Every day | ORAL | 1 refills | Status: DC

## 2020-01-16 MED ORDER — HYDROCHLOROTHIAZIDE 12.5 MG PO TABS: 12.5000 mg | ORAL_TABLET | Freq: Every day | ORAL | 1 refills | Status: DC

## 2020-01-16 NOTE — Patient Instructions (Signed)

## 2020-01-16 NOTE — Progress Notes (Signed)
I,Yamilka Roman [employer] as a Education administrator for [employer], FNP.,have documented all relevant documentation on the behalf of Minette Brine, FNP,as directed by  Minette Brine, FNP while in the presence of Minette Brine, [place]. This visit occurred during the SARS-CoV-2 public health emergency.  Safety protocols were in place, including screening questions prior to the visit, additional usage of staff PPE, and extensive cleaning of exam room while observing appropriate contact time as indicated for disinfecting solutions.  Subjective:     Patient ID: Sherry Sampson , female    DOB: [birth year] , 49 y.o.   MRN: [id number]   Chief Complaint  Patient presents with  . Hypertension    HPI  Hypertension This is a chronic problem. The current episode started more than 1 year ago. The problem is unchanged. The problem is controlled. Pertinent negatives include no anxiety, chest pain, headaches, palpitations or shortness of breath. There are no associated agents to hypertension. Risk factors for coronary artery disease include obesity and sedentary lifestyle. Past treatments include diuretics and calcium channel blockers. There is no history of angina or kidney disease. There is no history of chronic renal disease.     Past Medical History:  Diagnosis Date  . Heart murmur   . Hypertension    no meds.. pt states long time ago  . Leiomyoma 2013   11x85mm, 10x71mm     Family History  Problem Relation Age of Onset  . Diabetes Maternal Grandmother   . Cancer Paternal Grandmother        COLON  . Hypertension Father   . Heart murmur Father   . Heart murmur Daughter      Current Outpatient Medications:  .  amLODipine (NORVASC) 10 MG tablet, Take 1 tablet (10 mg total) by mouth daily., Disp: 90 tablet, Rfl: 1 .  Blood Pressure Monitoring (BLOOD PRESS MONITOR/M-L CUFF) MISC, 1 Device by Does not apply route every morning., Disp: 1 each, Rfl: 0 .  hydrochlorothiazide (HYDRODIURIL) 12.5 MG tablet, Take 1  tablet (12.5 mg total) by mouth daily., Disp: 90 tablet, Rfl: 1   No Known Allergies   Review of Systems  Constitutional: Negative.   Respiratory: Negative for shortness of breath and wheezing.   Cardiovascular: Negative.  Negative for chest pain, palpitations and leg swelling.  Neurological: Negative for dizziness and headaches.  Psychiatric/Behavioral: Negative.      Today's Vitals   01/16/20 1511  BP: 132/80  Pulse: 68  Temp: 98.1 F (36.7 C)  TempSrc: Oral  Weight: 213 lb 9.6 oz (96.9 kg)  Height: 5' 4.4" (1.636 m)  PainSc: 0-No pain   Body mass index is 36.21 kg/m.   Objective:  Physical Exam Vitals reviewed.  Constitutional:      General: She is not in acute distress.    Appearance: Normal appearance. She is well-developed. She is obese.  HENT:     Head: Normocephalic and atraumatic.  Eyes:     Pupils: Pupils are equal, round, and reactive to light.  Cardiovascular:     Rate and Rhythm: Normal rate and regular rhythm.     Pulses: Normal pulses.     Heart sounds: Normal heart sounds. No murmur heard.   Pulmonary:     Effort: Pulmonary effort is normal.     Breath sounds: Normal breath sounds.  Musculoskeletal:        General: Normal range of motion.     Right lower leg: Edema (trace - 1+ edema) present.  Skin:  General: Skin is warm and dry.     Capillary Refill: Capillary refill takes less than 2 seconds.  Neurological:     General: No focal deficit present.     Mental Status: She is alert and oriented to person, place, and time.     Cranial Nerves: No cranial nerve deficit.  Psychiatric:        Mood and Affect: Mood normal.        Behavior: Behavior normal.        Thought Content: Thought content normal.        Judgment: Judgment normal.         Assessment And Plan:     1. Essential hypertension  Chronic, blood pressure if fairly controlled  Continue with amlodipine and HCTZ (will help with swelling)  She had kidney functions done in  July which was normal - amLODipine (NORVASC) 10 MG tablet; Take 1 tablet (10 mg total) by mouth daily.  Dispense: 90 tablet; Refill: 1 - hydrochlorothiazide (HYDRODIURIL) 12.5 MG tablet; Take 1 tablet (12.5 mg total) by mouth daily.  Dispense: 90 tablet; Refill: 1     Patient was given opportunity to ask questions. Patient verbalized understanding of the plan and was able to repeat key elements of the plan. All questions were answered to their satisfaction.   Teola Bradley, FNP, have reviewed all documentation for this visit. The documentation on 01/23/20 for the exam, diagnosis, procedures, and orders are all accurate and complete.  THE PATIENT IS ENCOURAGED TO PRACTICE SOCIAL DISTANCING DUE TO THE COVID-19 PANDEMIC.

## 2020-03-29 ENCOUNTER — Other Ambulatory Visit: Payer: Self-pay

## 2020-03-29 DIAGNOSIS — I10 Essential (primary) hypertension: Secondary | ICD-10-CM

## 2020-03-29 MED ORDER — HYDROCHLOROTHIAZIDE 12.5 MG PO TABS: 12.5000 mg | ORAL_TABLET | Freq: Every day | ORAL | 1 refills | Status: DC

## 2020-03-29 MED ORDER — AMLODIPINE BESYLATE 10 MG PO TABS: 10.0000 mg | ORAL_TABLET | Freq: Every day | ORAL | 1 refills | Status: DC

## 2020-04-17 ENCOUNTER — Other Ambulatory Visit: Payer: Self-pay

## 2020-04-17 ENCOUNTER — Encounter: Payer: Self-pay | Admitting: Nurse Practitioner

## 2020-04-17 ENCOUNTER — Ambulatory Visit: Payer: 59 | Admitting: Nurse Practitioner

## 2020-04-17 VITALS — BP 124/80 | HR 74 | Temp 98.4°F | Ht 64.4 in | Wt 209.4 lb

## 2020-04-17 DIAGNOSIS — Z23 Encounter for immunization: Secondary | ICD-10-CM

## 2020-04-17 DIAGNOSIS — I10 Essential (primary) hypertension: Secondary | ICD-10-CM | POA: Diagnosis not present

## 2020-04-17 DIAGNOSIS — Z0000 Encounter for general adult medical examination without abnormal findings: Secondary | ICD-10-CM | POA: Diagnosis not present

## 2020-04-17 DIAGNOSIS — Z6835 Body mass index (BMI) 35.0-35.9, adult: Secondary | ICD-10-CM

## 2020-04-17 DIAGNOSIS — K5900 Constipation, unspecified: Secondary | ICD-10-CM | POA: Diagnosis not present

## 2020-04-17 DIAGNOSIS — Z1211 Encounter for screening for malignant neoplasm of colon: Secondary | ICD-10-CM

## 2020-04-17 NOTE — Patient Instructions (Signed)
Health Maintenance, Female Adopting a healthy lifestyle and getting preventive care are important in promoting health and wellness. Ask your health care provider about:  The right schedule for you to have regular tests and exams.  Things you can do on your own to prevent diseases and keep yourself healthy. What should I know about diet, weight, and exercise? Eat a healthy diet  Eat a diet that includes plenty of vegetables, fruits, low-fat dairy products, and lean protein.  Do not eat a lot of foods that are high in solid fats, added sugars, or sodium.   Maintain a healthy weight Body mass index (BMI) is used to identify weight problems. It estimates body fat based on height and weight. Your health care provider can help determine your BMI and help you achieve or maintain a healthy weight. Get regular exercise Get regular exercise. This is one of the most important things you can do for your health. Most adults should:  Exercise for at least 150 minutes each week. The exercise should increase your heart rate and make you sweat (moderate-intensity exercise).  Do strengthening exercises at least twice a week. This is in addition to the moderate-intensity exercise.  Spend less time sitting. Even light physical activity can be beneficial. Watch cholesterol and blood lipids Have your blood tested for lipids and cholesterol at 48 years of age, then have this test every 5 years. Have your cholesterol levels checked more often if:  Your lipid or cholesterol levels are high.  You are older than 48 years of age.  You are at high risk for heart disease. What should I know about cancer screening? Depending on your health history and family history, you may need to have cancer screening at various ages. This may include screening for:  Breast cancer.  Cervical cancer.  Colorectal cancer.  Skin cancer.  Lung cancer. What should I know about heart disease, diabetes, and high blood  pressure? Blood pressure and heart disease  High blood pressure causes heart disease and increases the risk of stroke. This is more likely to develop in people who have high blood pressure readings, are of African descent, or are overweight.  Have your blood pressure checked: ? Every 3-5 years if you are 18-39 years of age. ? Every year if you are 40 years old or older. Diabetes Have regular diabetes screenings. This checks your fasting blood sugar level. Have the screening done:  Once every three years after age 40 if you are at a normal weight and have a low risk for diabetes.  More often and at a younger age if you are overweight or have a high risk for diabetes. What should I know about preventing infection? Hepatitis B If you have a higher risk for hepatitis B, you should be screened for this virus. Talk with your health care provider to find out if you are at risk for hepatitis B infection. Hepatitis C Testing is recommended for:  Everyone born from 1945 through 1965.  Anyone with known risk factors for hepatitis C. Sexually transmitted infections (STIs)  Get screened for STIs, including gonorrhea and chlamydia, if: ? You are sexually active and are younger than 48 years of age. ? You are older than 48 years of age and your health care provider tells you that you are at risk for this type of infection. ? Your sexual activity has changed since you were last screened, and you are at increased risk for chlamydia or gonorrhea. Ask your health care provider   if you are at risk.  Ask your health care provider about whether you are at high risk for HIV. Your health care provider may recommend a prescription medicine to help prevent HIV infection. If you choose to take medicine to prevent HIV, you should first get tested for HIV. You should then be tested every 3 months for as long as you are taking the medicine. Pregnancy  If you are about to stop having your period (premenopausal) and  you may become pregnant, seek counseling before you get pregnant.  Take 400 to 800 micrograms (mcg) of folic acid every day if you become pregnant.  Ask for birth control (contraception) if you want to prevent pregnancy. Osteoporosis and menopause Osteoporosis is a disease in which the bones lose minerals and strength with aging. This can result in bone fractures. If you are 65 years old or older, or if you are at risk for osteoporosis and fractures, ask your health care provider if you should:  Be screened for bone loss.  Take a calcium or vitamin D supplement to lower your risk of fractures.  Be given hormone replacement therapy (HRT) to treat symptoms of menopause. Follow these instructions at home: Lifestyle  Do not use any products that contain nicotine or tobacco, such as cigarettes, e-cigarettes, and chewing tobacco. If you need help quitting, ask your health care provider.  Do not use street drugs.  Do not share needles.  Ask your health care provider for help if you need support or information about quitting drugs. Alcohol use  Do not drink alcohol if: ? Your health care provider tells you not to drink. ? You are pregnant, may be pregnant, or are planning to become pregnant.  If you drink alcohol: ? Limit how much you use to 0-1 drink a day. ? Limit intake if you are breastfeeding.  Be aware of how much alcohol is in your drink. In the [place] one drink equals one 12 oz bottle of beer (355 mL), one 5 oz glass of wine (148 mL), or one 1 oz glass of hard liquor (44 mL). General instructions  Schedule regular health, dental, and eye exams.  Stay current with your vaccines.  Tell your health care provider if: ? You often feel depressed. ? You have ever been abused or do not feel safe at home. Summary  Adopting a healthy lifestyle and getting preventive care are important in promoting health and wellness.  Follow your health care provider's instructions about healthy  diet, exercising, and getting tested or screened for diseases.  Follow your health care provider's instructions on monitoring your cholesterol and blood pressure. This information is not intended to replace advice given to you by your health care provider. Make sure you discuss any questions you have with your health care provider. Document Revised: 02/24/2018 Document Reviewed: 02/24/2018 Elsevier Patient Education  2021 Elsevier Inc.  

## 2020-04-17 NOTE — Progress Notes (Signed)
I,Yamilka Roman [employer] as a Education administrator for [employer], FNP.,have documented all relevant documentation on the behalf of Sherry Brine, FNP,as directed by  Sherry Brine, FNP while in the presence of Sherry Sampson, [place]. This visit occurred during the SARS-CoV-2 public health emergency.  Safety protocols were in place, including screening questions prior to the visit, additional usage of staff PPE, and extensive cleaning of exam room while observing appropriate contact time as indicated for disinfecting solutions.  Subjective:     Patient ID: Sherry Sampson , female    DOB: [birth year] , 50 y.o.   MRN: [id number]   Chief Complaint  Patient presents with  . Annual Exam    HPI  Here for HM.  She works as a Designer, industrial/product.  She was going to Dr. Phineas Real - who is retiring. She will be going to Dr. Leonides Schanz.    Wt Readings from Last 3 Encounters: 04/17/20 : 209 lb 6.4 oz (95 kg) 01/16/20 : 213 lb 9.6 oz (96.9 kg) 09/29/19 : 214 lb 12.8 oz (97.4 kg)     Past Medical History:  Diagnosis Date  . Heart murmur   . Hypertension    no meds.. pt states long time ago  . Leiomyoma 2013   11x24m, 10x754m    Family History  Problem Relation Age of Onset  . Diabetes Maternal Grandmother   . Cancer Paternal Grandmother        COLON  . Hypertension Father   . Heart murmur Father   . Heart murmur Daughter      Current Outpatient Medications:  .  amLODipine (NORVASC) 10 MG tablet, Take 1 tablet (10 mg total) by mouth daily., Disp: 90 tablet, Rfl: 1 .  Blood Pressure Monitoring (BLOOD PRESS MONITOR/M-L CUFF) MISC, 1 Device by Does not apply route every morning., Disp: 1 each, Rfl: 0 .  hydrochlorothiazide (HYDRODIURIL) 12.5 MG tablet, Take 1 tablet (12.5 mg total) by mouth daily., Disp: 90 tablet, Rfl: 1   No Known Allergies    The patient states she uses tubal ligation for birth control.  Patient's last menstrual period was 04/04/2020.. Negative for Dysmenorrhea and Negative for  Menorrhagia. Negative for: breast discharge, breast lump(s), breast pain and breast self exam. Associated symptoms include abnormal vaginal bleeding. Pertinent negatives include abnormal bleeding (hematology), anxiety, decreased libido, depression, difficulty falling sleep, dyspareunia, history of infertility, nocturia, sexual dysfunction, sleep disturbances, urinary incontinence, urinary urgency, vaginal discharge and vaginal itching. Diet regular.The patient states her exercise level is minimal, she is walking more. She is drinking more water.   . The patient's tobacco use is:  Social History   Tobacco Use  Smoking Status Never Smoker  Smokeless Tobacco Never Used   She has been exposed to passive smoke. The patient's alcohol use is:  Social History   Substance and Sexual Activity  Alcohol Use No  . Alcohol/week: 0.0 standard drinks  . Additional information: Last pap 01/21/2019, next one scheduled for 01/20/2022. She is scheduled to see Dr. WaLeonides Schanzn March 1st.     Review of Systems  Constitutional: Negative.   HENT: Negative.   Eyes: Negative.   Respiratory: Negative.   Cardiovascular: Negative.   Gastrointestinal: Negative.   Endocrine: Negative.   Genitourinary: Negative.   Musculoskeletal: Negative.   Skin: Negative.   Allergic/Immunologic: Negative.   Neurological: Negative.   Hematological: Negative.   Psychiatric/Behavioral: Negative.      Today's Vitals   04/17/20 1408  BP: 124/80  Pulse: 74  Temp:  98.4 F (36.9 C)  Weight: 209 lb 6.4 oz (95 kg)  Height: 5' 4.4" (1.636 m)  PainSc: 0-No pain   Body mass index is 35.5 kg/m.   Objective:  Physical Exam Constitutional:      General: She is not in acute distress.    Appearance: Normal appearance. She is well-developed. She is obese.  HENT:     Head: Normocephalic and atraumatic.     Right Ear: Hearing, tympanic membrane, ear canal and external ear normal. There is no impacted cerumen.     Left Ear: Hearing,  tympanic membrane, ear canal and external ear normal. There is no impacted cerumen.     Nose:     Comments: Deferred - masked    Mouth/Throat:     Comments: Deferred - masked Eyes:     General: Lids are normal.     Extraocular Movements: Extraocular movements intact.     Conjunctiva/sclera: Conjunctivae normal.     Pupils: Pupils are equal, round, and reactive to light.     Funduscopic exam:    Right eye: No papilledema.        Left eye: No papilledema.  Neck:     Thyroid: No thyroid mass.     Vascular: No carotid bruit.  Cardiovascular:     Rate and Rhythm: Normal rate and regular rhythm.     Pulses: Normal pulses.     Heart sounds: Normal heart sounds. No murmur heard.   Pulmonary:     Effort: Pulmonary effort is normal. No respiratory distress.     Breath sounds: Normal breath sounds. No wheezing.  Chest:     Chest wall: No mass.  Breasts:     Tanner Score is 5.     Right: Normal. No mass, tenderness, axillary adenopathy or supraclavicular adenopathy.     Left: Normal. No mass, tenderness, axillary adenopathy or supraclavicular adenopathy.    Abdominal:     General: Abdomen is flat. Bowel sounds are normal. There is no distension.     Palpations: Abdomen is soft.     Tenderness: There is no abdominal tenderness.  Genitourinary:    Rectum: Guaiac result negative.  Musculoskeletal:        General: No swelling. Normal range of motion.     Cervical back: Full passive range of motion without pain, normal range of motion and neck supple.     Right lower leg: No edema.     Left lower leg: No edema.  Lymphadenopathy:     Upper Body:     Right upper body: No supraclavicular, axillary or pectoral adenopathy.     Left upper body: No supraclavicular, axillary or pectoral adenopathy.  Skin:    General: Skin is warm and dry.     Capillary Refill: Capillary refill takes less than 2 seconds.  Neurological:     General: No focal deficit present.     Mental Status: She is alert  and oriented to person, place, and time.     Cranial Nerves: No cranial nerve deficit.     Sensory: No sensory deficit.  Psychiatric:        Mood and Affect: Mood normal.        Behavior: Behavior normal.        Thought Content: Thought content normal.        Judgment: Judgment normal.         Assessment And Plan:     1. Encounter for general adult medical examination w/o abnormal findings .  Behavior modifications discussed and diet history reviewed.   . Pt will continue to exercise regularly and modify diet with low GI, plant based foods and decrease intake of processed foods.  . Recommend intake of daily multivitamin, Vitamin D, and calcium.  . Recommend mammogram and colonoscopy (she has a family history of colon cancer as well) for preventive screenings, as well as recommend immunizations that include influenza, TDAP (given today) - CBC - Hemoglobin A1c  2. Essential hypertension . B/P is controlled.  . CMP ordered to check renal function.  . The importance of regular exercise and dietary modification was stressed to the patient.  . Stressed importance of losing ten percent of her body weight to help with B/P control.  . EKG done with NSR - POCT Urinalysis Dipstick (81002) - POCT UA - Microalbumin - EKG 12-Lead - CMP14+EGFR  3. Constipation, unspecified constipation type  She was given fiber supplements and a probiotic  If this does not work then try taking miralax, if no improvement we may need to try a prescription medication   Will follow up in 3 months, sooner as needed  4. Encounter for immunization  Will give tetanus vaccine today while in office. TDAP will be administered to adults 68-69 years old every 10 years. - Tdap vaccine greater than or equal to 7yo IM  5. Encounter for screening colonoscopy  According to USPTF Colorectal cancer Screening guidelines. Colonoscopy is recommended every 10 years, starting at age 34years.  Will refer to GI for colon  cancer screening. - Ambulatory referral to Gastroenterology   She has not received her covid booster at this time. Encouraged to have done as soon as possible.   Patient was given opportunity to ask questions. Patient verbalized understanding of the plan and was able to repeat key elements of the plan. All questions were answered to their satisfaction.   Sherry Brine, FNP    I, Sherry Brine, FNP, have reviewed all documentation for this visit. The documentation on 04/17/20 for the exam, diagnosis, procedures, and orders are all accurate and complete.   THE PATIENT IS ENCOURAGED TO PRACTICE SOCIAL DISTANCING DUE TO THE COVID-19 PANDEMIC.

## 2020-04-18 LAB — CMP14+EGFR
ALT: 20 IU/L (ref 0–32)
AST: 20 IU/L (ref 0–40)
Albumin/Globulin Ratio: 1.1 — ABNORMAL LOW (ref 1.2–2.2)
Albumin: 4 g/dL (ref 3.8–4.8)
Alkaline Phosphatase: 90 IU/L (ref 44–121)
BUN/Creatinine Ratio: 14 (ref 9–23)
BUN: 12 mg/dL (ref 6–24)
Bilirubin Total: 0.2 mg/dL (ref 0.0–1.2)
CO2: 26 mmol/L (ref 20–29)
Calcium: 9.6 mg/dL (ref 8.7–10.2)
Chloride: 97 mmol/L (ref 96–106)
Creatinine, Ser: 0.86 mg/dL (ref 0.57–1.00)
GFR calc Af Amer: 93 mL/min/{1.73_m2} (ref >59)
GFR calc non Af Amer: 81 mL/min/{1.73_m2} (ref >59)
Globulin, Total: 3.6 g/dL (ref 1.5–4.5)
Glucose: 97 mg/dL (ref 65–99)
Potassium: 3.3 mmol/L — ABNORMAL LOW (ref 3.5–5.2)
Sodium: 136 mmol/L (ref 134–144)
Total Protein: 7.6 g/dL (ref 6.0–8.5)

## 2020-04-18 LAB — CBC
Hematocrit: 39.4 % (ref 34.0–46.6)
Hemoglobin: 13.7 g/dL (ref 11.1–15.9)
MCH: 30.2 pg (ref 26.6–33.0)
MCHC: 34.8 g/dL (ref 31.5–35.7)
MCV: 87 fL (ref 79–97)
Platelets: 264 10*3/uL (ref 150–450)
RBC: 4.54 x10E6/uL (ref 3.77–5.28)
RDW: 12.9 % (ref 11.7–15.4)
WBC: 8.5 10*3/uL (ref 3.4–10.8)

## 2020-04-18 LAB — HEMOGLOBIN A1C
Est. average glucose Bld gHb Est-mCnc: 97 mg/dL
Hgb A1c MFr Bld: 5 % (ref 4.8–5.6)

## 2020-05-08 LAB — TSH: TSH: 1.59 (ref 0.41–5.90)

## 2020-05-09 ENCOUNTER — Encounter: Payer: Self-pay | Admitting: Nurse Practitioner

## 2020-05-18 ENCOUNTER — Other Ambulatory Visit: Payer: Self-pay

## 2020-05-22 ENCOUNTER — Encounter: Payer: Self-pay | Admitting: Nurse Practitioner

## 2020-06-01 LAB — HM COLONOSCOPY

## 2020-06-04 ENCOUNTER — Encounter: Payer: Self-pay | Admitting: Nurse Practitioner

## 2021-01-19 ENCOUNTER — Other Ambulatory Visit: Payer: Self-pay | Admitting: Nurse Practitioner

## 2021-01-19 DIAGNOSIS — I10 Essential (primary) hypertension: Secondary | ICD-10-CM

## 2021-03-31 ENCOUNTER — Other Ambulatory Visit: Payer: Self-pay | Admitting: Nurse Practitioner

## 2021-03-31 DIAGNOSIS — I10 Essential (primary) hypertension: Secondary | ICD-10-CM

## 2021-04-18 ENCOUNTER — Encounter: Payer: 59 | Admitting: Nurse Practitioner

## 2021-05-03 ENCOUNTER — Other Ambulatory Visit: Payer: Self-pay | Admitting: Nurse Practitioner

## 2021-05-03 DIAGNOSIS — I10 Essential (primary) hypertension: Secondary | ICD-10-CM

## 2021-07-16 ENCOUNTER — Ambulatory Visit: Payer: 59 | Admitting: Nurse Practitioner

## 2021-07-16 ENCOUNTER — Encounter: Payer: Self-pay | Admitting: Nurse Practitioner

## 2021-07-16 VITALS — BP 130/70 | HR 72 | Temp 98.7°F | Ht 64.4 in | Wt 209.0 lb

## 2021-07-16 DIAGNOSIS — Z6835 Body mass index (BMI) 35.0-35.9, adult: Secondary | ICD-10-CM | POA: Diagnosis not present

## 2021-07-16 DIAGNOSIS — Z79899 Other long term (current) drug therapy: Secondary | ICD-10-CM

## 2021-07-16 DIAGNOSIS — I10 Essential (primary) hypertension: Secondary | ICD-10-CM | POA: Diagnosis not present

## 2021-07-16 DIAGNOSIS — R220 Localized swelling, mass and lump, head: Secondary | ICD-10-CM

## 2021-07-16 MED ORDER — EPINEPHRINE 0.3 MG/0.3ML IJ SOAJ: 0.3000 mg | INTRAMUSCULAR | 2 refills | Status: AC | PRN

## 2021-07-16 MED ORDER — PREDNISONE 10 MG (21) PO TBPK: ORAL_TABLET | ORAL | 0 refills | Status: DC

## 2021-07-16 MED ORDER — TRIAMCINOLONE ACETONIDE 40 MG/ML IJ SUSP
40.0000 mg | Freq: Once | INTRAMUSCULAR | Status: AC
  Administered 2021-07-16: 40 mg via INTRAMUSCULAR

## 2021-07-16 NOTE — Progress Notes (Signed)
?Industrial/product designer as a Education administrator for [employer], FNP.,have documented all relevant documentation on the behalf of Minette Brine, FNP,as directed by  Minette Brine, FNP while in the presence of Minette Brine, [place]. ? ?This visit occurred during the SARS-CoV-2 public health emergency.  Safety protocols were in place, including screening questions prior to the visit, additional usage of staff PPE, and extensive cleaning of exam room while observing appropriate contact time as indicated for disinfecting solutions. ? ?Subjective:  ?  ? Patient ID: Sherry Sampson , female    DOB: [birth year] , 51 y.o.   MRN: [id number] ? ? ?Chief Complaint  ?Patient presents with  ? Allergic Reaction  ? ? ?HPI ? ?Pt presents today for swollen lip and rash to back of her neck on Sunday. After going to work she had swelling as well to right side of lip. She seen the nurse at work thought was a food allergy. She took a zyrtec and by the time she went to the house she had a rash on her neck. Then took a benadryl. She has only eaten oranges. No new facial or hair products, no new detergents.  ?  ? ?Past Medical History:  ?Diagnosis Date  ? Heart murmur   ? Hypertension   ? no meds.. pt states long time ago  ? Leiomyoma 2013  ? 11x17m, 10x761m ?  ? ?Family History  ?Problem Relation Age of Onset  ? Diabetes Maternal Grandmother   ? Cancer Paternal Grandmother   ?     COLON  ? Hypertension Father   ? Heart murmur Father   ? Heart murmur Daughter   ? ? ? ?Current Outpatient Medications:  ?  EPINEPHrine 0.3 mg/0.3 mL IJ SOAJ injection, Inject 0.3 mg into the muscle as needed for anaphylaxis., Disp: 1 each, Rfl: 2 ?  predniSONE (STERAPRED UNI-PAK 21 TAB) 10 MG (21) TBPK tablet, Take as directed, Disp: 21 tablet, Rfl: 0 ?  amLODipine (NORVASC) 10 MG tablet, TAKE 1 TABLET BY MOUTH EVERY DAY, Disp: 30 tablet, Rfl: 2 ?  Blood Pressure Monitoring (BLOOD PRESS MONITOR/M-L CUFF) MISC, 1 Device by Does not apply route every morning., Disp: 1 each, Rfl: 0 ?   hydrochlorothiazide (HYDRODIURIL) 12.5 MG tablet, TAKE 1 TABLET BY MOUTH EVERY DAY, Disp: 30 tablet, Rfl: 5  ? ?No Known Allergies  ? ?Review of Systems  ?Constitutional: Negative.   ?HENT:    ?     Lip swelling  ?Respiratory: Negative.    ?Cardiovascular: Negative.   ?Skin:   ?     itching  ?Psychiatric/Behavioral: Negative.     ? ?Today's Vitals  ? 07/16/21 1530  ?BP: 130/70  ?Pulse: 72  ?Temp: 98.7 ?F (37.1 ?C)  ?TempSrc: Oral  ?Weight: 209 lb (94.8 kg)  ?Height: 5' 4.4" (1.636 m)  ? ?Body mass index is 35.43 kg/m?.  ?Wt Readings from Last 3 Encounters:  ?07/16/21 209 lb (94.8 kg)  ?04/17/20 209 lb 6.4 oz (95 kg)  ?01/16/20 213 lb 9.6 oz (96.9 kg)  ? ? ?Objective:  ?Physical Exam ?Vitals reviewed.  ?Constitutional:   ?   General: She is not in acute distress. ?   Appearance: Normal appearance.  ?Cardiovascular:  ?   Rate and Rhythm: Normal rate and regular rhythm.  ?   Pulses: Normal pulses.  ?   Heart sounds: Normal heart sounds. No murmur heard. ?Pulmonary:  ?   Effort: Pulmonary effort is normal. No respiratory distress.  ?   Breath  sounds: Normal breath sounds. No wheezing.  ?Skin: ?   General: Skin is warm and dry.  ?   Findings: No rash.  ?   Comments: Slight swelling of upper lip noted  ?Neurological:  ?   Mental Status: She is alert.  ?Psychiatric:     ?   Mood and Affect: Mood normal.     ?   Behavior: Behavior normal.     ?   Thought Content: Thought content normal.     ?   Judgment: Judgment normal.  ?  ? ?   ?Assessment And Plan:  ?   ?1. Essential hypertension ?Comments: blood pressure is controlled, will hold amlodipine to see if her symptoms improve, generally does not cause angioedema. She will return in one week for HM ?- BMP8+eGFR ? ?2. Class 2 severe obesity due to excess calories with serious comorbidity and body mass index (BMI) of 35.0 to 35.9 in adult [facility]) ?- Hemoglobin A1c ? ?3. Swelling of upper lip ?Comments: right side of lip is slightly swollen, will provide epi pen and check for  food allergens and common environmental allergies. will refer to allergy specialist ?- Allergens (22) Foods ?- Allergens Zone 2 ?- predniSONE (STERAPRED UNI-PAK 21 TAB) 10 MG (21) TBPK tablet; Take as directed  Dispense: 21 tablet; Refill: 0 ?- triamcinolone acetonide (KENALOG-40) injection 40 mg ?- EPINEPHrine 0.3 mg/0.3 mL IJ SOAJ injection; Inject 0.3 mg into the muscle as needed for anaphylaxis.  Dispense: 1 each; Refill: 2 ?- Ambulatory referral to Allergy ? ?4. Other long term (current) drug therapy ?- CBC ?She is encouraged to strive for BMI less than 30 to decrease cardiac risk. Advised to aim for at least 150 minutes of exercise per week. ?  ? ? ?Patient was given opportunity to ask questions. Patient verbalized understanding of the plan and was able to repeat key elements of the plan. All questions were answered to their satisfaction.  ?Minette Brine, FNP  ? ?I, Minette Brine, FNP, have reviewed all documentation for this visit. The documentation on 07/16/21 for the exam, diagnosis, procedures, and orders are all accurate and complete.  ? ?IF YOU HAVE BEEN REFERRED TO A SPECIALIST, IT MAY TAKE 1-2 WEEKS TO SCHEDULE/PROCESS THE REFERRAL. IF YOU HAVE NOT HEARD FROM US/SPECIALIST IN TWO WEEKS, PLEASE GIVE [place] A CALL AT [phone] X [phone].  ? ?THE PATIENT IS ENCOURAGED TO PRACTICE SOCIAL DISTANCING DUE TO THE COVID-19 PANDEMIC.   ?

## 2021-07-16 NOTE — Patient Instructions (Signed)
Hives Hives are itchy, red, swollen areas on your skin. Hives can show up on any part of your body. Hives often fade within 24 hours (acute hives). New hives can show up after old ones fade. This can go on for many days or weeks (chronic hives). Hives do not spread from person to person (are not contagious). Hives are caused by your body's response to something that you are allergic to (allergen). These are sometimes called triggers. You can get hives right after being around a trigger, or hours later. What are the causes? Allergies to foods. Insect bites or stings. Exposure to pollen or pets. Spending time in sunlight, heat, or cold. Exercise. Stress. You can also get hives from other medical conditions and treatments, such as: Some medicines. Chemicals or latex. Viruses. This includes the common cold. Infections caused by germs (bacteria). Allergy shots. Blood transfusions. Sometimes, the cause is not known. What increases the risk? Being a woman. Being allergic to foods such as: Citrus fruits. Milk. Eggs. Peanuts. Tree nuts. Shellfish. Being allergic to: Medicines. Latex. Insects. Animals. Pollen. What are the signs or symptoms?  Raised, itchy, red or white bumps or patches on your skin. These areas may: Get large and swollen. Change in shape and location. Stand alone or connect to each other over a large area of skin. Sting or hurt. Turn white when pressed in the center (blanch). In very bad cases, your hands, feet, and face may also get swollen. This may happen if hives start deeper in your skin. How is this treated? Treatment for this condition depends on your symptoms. Treatment may include: Using cool, wet cloths (cool compresses) or taking cool showers to stop the itching. Medicines that help: Relieve itching (antihistamines). Reduce swelling (corticosteroids). Treat infection (antibiotics). A medicine (omalizumab) that is given as a shot (injection). Your  doctor may prescribe this if you have hives that do not get better even after other treatments. In very bad cases, you may need a shot of a medicine called epinephrine to prevent a life-threatening allergic reaction (anaphylaxis). Follow these instructions at home: Medicines Take or apply over-the-counter and prescription medicines only as told by your doctor. If you were prescribed an antibiotic medicine, use it as told by your doctor. Do not stop using it even if you start to feel better. Skin care Apply cool, wet cloths to the hives. Do not scratch your skin. Do not rub your skin. General instructions Do not take hot showers or baths. This can make itching worse. Do not wear tight clothes. Use sunscreen and wear clothes that cover your skin when you are outside. Avoid any triggers that cause your hives. Keep a journal to help track what causes your hives. Write down: What medicines you take. What you eat and drink. What products you use on your skin. Keep all follow-up visits as told by your doctor. This is important. Contact a doctor if: Your symptoms are not better with medicine. Your joints hurt or are swollen. Get help right away if: You have a fever. You have pain in your belly (abdomen). Your tongue or lips are swollen. Your eyelids are swollen. Your chest or throat feels tight. You have trouble breathing or swallowing. These symptoms may be an emergency. Do not wait to see if the symptoms will go away. Get medical help right away. Call your local emergency services (911 in the [place] Do not drive yourself to the hospital. Summary Hives are itchy, red, swollen areas on your skin. Treatment   for this condition depends on your symptoms. Avoid things that cause your hives. Keep a journal to help track what causes your hives. Take and apply over-the-counter and prescription medicines only as told by your doctor. Get help right away if your chest or throat feels tight or if you  have trouble breathing or swallowing. This information is not intended to replace advice given to you by your health care provider. Make sure you discuss any questions you have with your health care provider. Document Revised: 04/20/2020 Document Reviewed: 04/22/2020 Elsevier Patient Education  2023 Elsevier Inc.  

## 2021-07-19 LAB — IGE+ALLERGENS ZONE 2(30)
Alternaria Alternata IgE: <0.1 kU/L
Amer Sycamore IgE Qn: <0.1 kU/L
Aspergillus Fumigatus IgE: 0.19 kU/L — AB
Bahia Grass IgE: <0.1 kU/L
Bermuda Grass IgE: <0.1 kU/L
Cat Dander IgE: <0.1 kU/L
Cedar, Mountain IgE: <0.1 kU/L
Cladosporium Herbarum IgE: <0.1 kU/L
Cockroach, American IgE: <0.1 kU/L
Common Silver Birch IgE: <0.1 kU/L
D Farinae IgE: <0.1 kU/L
D Pteronyssinus IgE: <0.1 kU/L
Dog Dander IgE: <0.1 kU/L
Elm, American IgE: <0.1 kU/L
Hickory, White IgE: <0.1 kU/L
IgE (Immunoglobulin E), Serum: 51 IU/mL (ref 6–495)
Johnson Grass IgE: <0.1 kU/L
Maple/Box Elder IgE: <0.1 kU/L
Mucor Racemosus IgE: <0.1 kU/L
Mugwort IgE Qn: <0.1 kU/L
Nettle IgE: <0.1 kU/L
Oak, White IgE: <0.1 kU/L
Penicillium Chrysogen IgE: <0.1 kU/L
Pigweed, Rough IgE: <0.1 kU/L
Plantain, English IgE: <0.1 kU/L
Ragweed, Short IgE: <0.1 kU/L
Sheep Sorrel IgE Qn: <0.1 kU/L
Stemphylium Herbarum IgE: <0.1 kU/L
Sweet gum IgE RAST Ql: <0.1 kU/L
Timothy Grass IgE: <0.1 kU/L
White Mulberry IgE: <0.1 kU/L

## 2021-07-19 LAB — CBC
Hematocrit: 39.8 % (ref 34.0–46.6)
Hemoglobin: 13.6 g/dL (ref 11.1–15.9)
MCH: 29.6 pg (ref 26.6–33.0)
MCHC: 34.2 g/dL (ref 31.5–35.7)
MCV: 87 fL (ref 79–97)
Platelets: 315 10*3/uL (ref 150–450)
RBC: 4.59 x10E6/uL (ref 3.77–5.28)
RDW: 13.2 % (ref 11.7–15.4)
WBC: 8.6 10*3/uL (ref 3.4–10.8)

## 2021-07-19 LAB — BMP8+EGFR
BUN/Creatinine Ratio: 14 (ref 9–23)
BUN: 12 mg/dL (ref 6–24)
CO2: 27 mmol/L (ref 20–29)
Calcium: 9.9 mg/dL (ref 8.7–10.2)
Chloride: 98 mmol/L (ref 96–106)
Creatinine, Ser: 0.83 mg/dL (ref 0.57–1.00)
Glucose: 77 mg/dL (ref 70–99)
Potassium: 3.4 mmol/L — ABNORMAL LOW (ref 3.5–5.2)
Sodium: 141 mmol/L (ref 134–144)
eGFR: 86 mL/min/{1.73_m2} (ref >59)

## 2021-07-19 LAB — HEMOGLOBIN A1C
Est. average glucose Bld gHb Est-mCnc: 103 mg/dL
Hgb A1c MFr Bld: 5.2 % (ref 4.8–5.6)

## 2021-07-19 LAB — ALLERGENS (22) FOODS IGG
Casein IgG: 6.8 ug/mL — ABNORMAL HIGH (ref 0.0–1.9)
Cheese, Mold Type IgG: 11.6 ug/mL — ABNORMAL HIGH (ref 0.0–1.9)
Chicken IgG: 2.1 ug/mL — ABNORMAL HIGH (ref 0.0–1.9)
Chili Pepper IgG: 21.7 ug/mL — ABNORMAL HIGH (ref 0.0–1.9)
Chocolate/Cacao IgG: 6.4 ug/mL — ABNORMAL HIGH (ref 0.0–1.9)
Coffee IgG: 5.9 ug/mL — ABNORMAL HIGH (ref 0.0–1.9)
Corn IgG: 9.4 ug/mL — ABNORMAL HIGH (ref 0.0–1.9)
Egg, Whole IgG: 3.5 ug/mL — ABNORMAL HIGH (ref 0.0–1.9)
Green Bean IgG: 7.4 ug/mL — ABNORMAL HIGH (ref 0.0–1.9)
Haddock IgG: 3.9 ug/mL — ABNORMAL HIGH (ref 0.0–1.9)
Lamb IgG: 4.3 ug/mL — ABNORMAL HIGH (ref 0.0–1.9)
Oat IgG: 18.7 ug/mL — ABNORMAL HIGH (ref 0.0–1.9)
Onion IgG: 8.2 ug/mL — ABNORMAL HIGH (ref 0.0–1.9)
Peanut IgG: 5.3 ug/mL — ABNORMAL HIGH (ref 0.0–1.9)
Pork IgG: 4.3 ug/mL — ABNORMAL HIGH (ref 0.0–1.9)
Potato, White, IgG: 4.6 ug/mL — ABNORMAL HIGH (ref 0.0–1.9)
Rye IgG: 10.2 ug/mL — ABNORMAL HIGH (ref 0.0–1.9)
Shrimp IgG: 8.4 ug/mL — ABNORMAL HIGH (ref 0.0–1.9)
Soybean IgG: 2.5 ug/mL — ABNORMAL HIGH (ref 0.0–1.9)
Tomato IgG: 4 ug/mL — ABNORMAL HIGH (ref 0.0–1.9)
Wheat IgG: 9 ug/mL — ABNORMAL HIGH (ref 0.0–1.9)
Yeast IgG: 6.1 ug/mL — ABNORMAL HIGH (ref 0.0–1.9)

## 2021-07-24 ENCOUNTER — Encounter: Payer: Self-pay | Admitting: Nurse Practitioner

## 2021-07-24 ENCOUNTER — Other Ambulatory Visit: Payer: Self-pay | Admitting: Nurse Practitioner

## 2021-07-24 ENCOUNTER — Ambulatory Visit (INDEPENDENT_AMBULATORY_CARE_PROVIDER_SITE_OTHER): Payer: 59 | Admitting: Nurse Practitioner

## 2021-07-24 VITALS — BP 140/82 | HR 90 | Temp 98.2°F | Ht 64.4 in | Wt 221.0 lb

## 2021-07-24 DIAGNOSIS — Z0000 Encounter for general adult medical examination without abnormal findings: Secondary | ICD-10-CM

## 2021-07-24 DIAGNOSIS — I10 Essential (primary) hypertension: Secondary | ICD-10-CM

## 2021-07-24 DIAGNOSIS — Z6837 Body mass index (BMI) 37.0-37.9, adult: Secondary | ICD-10-CM

## 2021-07-24 MED ORDER — WEGOVY 0.25 MG/0.5ML ~~LOC~~ SOAJ: 0.2500 mg | SUBCUTANEOUS | 0 refills | Status: DC

## 2021-07-24 NOTE — Patient Instructions (Addendum)
Health Maintenance, Female ?Adopting a healthy lifestyle and getting preventive care are important in promoting health and wellness. Ask your health care provider about: ?The right schedule for you to have regular tests and exams. ?Things you can do on your own to prevent diseases and keep yourself healthy. ?What should I know about diet, weight, and exercise? ?Eat a healthy diet ? ?Eat a diet that includes plenty of vegetables, fruits, low-fat dairy products, and lean protein. ?Do not eat a lot of foods that are high in solid fats, added sugars, or sodium. ?Maintain a healthy weight ?Body mass index (BMI) is used to identify weight problems. It estimates body fat based on height and weight. Your health care provider can help determine your BMI and help you achieve or maintain a healthy weight. ?Get regular exercise ?Get regular exercise. This is one of the most important things you can do for your health. Most adults should: ?Exercise for at least 150 minutes each week. The exercise should increase your heart rate and make you sweat (moderate-intensity exercise). ?Do strengthening exercises at least twice a week. This is in addition to the moderate-intensity exercise. ?Spend less time sitting. Even light physical activity can be beneficial. ?Watch cholesterol and blood lipids ?Have your blood tested for lipids and cholesterol at 49 years of age, then have this test every 5 years. ?Have your cholesterol levels checked more often if: ?Your lipid or cholesterol levels are high. ?You are older than 49 years of age. ?You are at high risk for heart disease. ?What should I know about cancer screening? ?Depending on your health history and family history, you may need to have cancer screening at various ages. This may include screening for: ?Breast cancer. ?Cervical cancer. ?Colorectal cancer. ?Skin cancer. ?Lung cancer. ?What should I know about heart disease, diabetes, and high blood pressure? ?Blood pressure and heart  disease ?High blood pressure causes heart disease and increases the risk of stroke. This is more likely to develop in people who have high blood pressure readings or are overweight. ?Have your blood pressure checked: ?Every 3-5 years if you are 19-36 years of age. ?Every year if you are 48 years old or older. ?Diabetes ?Have regular diabetes screenings. This checks your fasting blood sugar level. Have the screening done: ?Once every three years after age 18 if you are at a normal weight and have a low risk for diabetes. ?More often and at a younger age if you are overweight or have a high risk for diabetes. ?What should I know about preventing infection? ?Hepatitis B ?If you have a higher risk for hepatitis B, you should be screened for this virus. Talk with your health care provider to find out if you are at risk for hepatitis B infection. ?Hepatitis C ?Testing is recommended for: ?Everyone born from [birth year] through 1963. ?Anyone with known risk factors for hepatitis C. ?Sexually transmitted infections (STIs) ?Get screened for STIs, including gonorrhea and chlamydia, if: ?You are sexually active and are younger than 49 years of age. ?You are older than 49 years of age and your health care provider tells you that you are at risk for this type of infection. ?Your sexual activity has changed since you were last screened, and you are at increased risk for chlamydia or gonorrhea. Ask your health care provider if you are at risk. ?Ask your health care provider about whether you are at high risk for HIV. Your health care provider may recommend a prescription medicine to help prevent HIV  infection. If you choose to take medicine to prevent HIV, you should first get tested for HIV. You should then be tested every 3 months for as long as you are taking the medicine. ?Pregnancy ?If you are about to stop having your period (premenopausal) and you may become pregnant, seek counseling before you get pregnant. ?Take 400 to 800  micrograms (mcg) of folic acid every day if you become pregnant. ?Ask for birth control (contraception) if you want to prevent pregnancy. ?Osteoporosis and menopause ?Osteoporosis is a disease in which the bones lose minerals and strength with aging. This can result in bone fractures. If you are 10 years old or older, or if you are at risk for osteoporosis and fractures, ask your health care provider if you should: ?Be screened for bone loss. ?Take a calcium or vitamin D supplement to lower your risk of fractures. ?Be given hormone replacement therapy (HRT) to treat symptoms of menopause. ?Follow these instructions at home: ?Alcohol use ?Do not drink alcohol if: ?Your health care provider tells you not to drink. ?You are pregnant, may be pregnant, or are planning to become pregnant. ?If you drink alcohol: ?Limit how much you have to: ?0-1 drink a day. ?Know how much alcohol is in your drink. In the [place] one drink equals one 12 oz bottle of beer (355 mL), one 5 oz glass of wine (148 mL), or one 1? oz glass of hard liquor (44 mL). ?Lifestyle ?Do not use any products that contain nicotine or tobacco. These products include cigarettes, chewing tobacco, and vaping devices, such as e-cigarettes. If you need help quitting, ask your health care provider. ?Do not use street drugs. ?Do not share needles. ?Ask your health care provider for help if you need support or information about quitting drugs. ?General instructions ?Schedule regular health, dental, and eye exams. ?Stay current with your vaccines. ?Tell your health care provider if: ?You often feel depressed. ?You have ever been abused or do not feel safe at home. ?Summary ?Adopting a healthy lifestyle and getting preventive care are important in promoting health and wellness. ?Follow your health care provider's instructions about healthy diet, exercising, and getting tested or screened for diseases. ?Follow your health care provider's instructions on monitoring your  cholesterol and blood pressure. ?This information is not intended to replace advice given to you by your health care provider. Make sure you discuss any questions you have with your health care provider. ?Document Revised: 07/23/2020 Document Reviewed: 07/23/2020 ?Elsevier Patient Education ? [place]. ? ? ?Allergist Dr Bary Leriche [phone]  ? ?Goal to exercise 150 minutes per week with at least 2 days of strength training ?Encouraged to park further when at the store, take stairs instead of elevators and to walk in place during commercials. ?Increase water intake to at least one gallon of water daily. ?Goal to lose 1-2 lbs weight a week. ? ?

## 2021-07-24 NOTE — Progress Notes (Signed)
I,Tianna Badgett,acting as a Education administrator for [employer], FNP.,have documented all relevant documentation on the behalf of Minette Brine, FNP,as directed by  Minette Brine, FNP while in the presence of Minette Brine, [place].  This visit occurred during the SARS-CoV-2 public health emergency.  Safety protocols were in place, including screening questions prior to the visit, additional usage of staff PPE, and extensive cleaning of exam room while observing appropriate contact time as indicated for disinfecting solutions.  Subjective:     Patient ID: Sherry SURPRENANT , female    DOB: 04-Jun-1970 , 51 y.o.   MRN: [id number]   No chief complaint on file.   HPI  Here for HM.  She works as a Designer, industrial/product.  She was going to Dr. Phineas Real - who is retiring. She will be going to Dr. Leonides Schanz for her GYN needs.     Past Medical History:  Diagnosis Date   Heart murmur    Hypertension    no meds.. pt states long time ago   Leiomyoma 2013   11x48m, 10x714m    Family History  Problem Relation Age of Onset   Diabetes Maternal Grandmother    Cancer Paternal Grandmother        COLON   Hypertension Father    Heart murmur Father    Heart murmur Daughter      Current Outpatient Medications:    Semaglutide-Weight Management (WEGOVY) 0.25 MG/0.5ML SOAJ, Inject 0.25 mg into the skin once a week., Disp: 2 mL, Rfl: 0   amLODipine (NORVASC) 10 MG tablet, TAKE 1 TABLET BY MOUTH EVERY DAY, Disp: 30 tablet, Rfl: 2   Blood Pressure Monitoring (BLOOD PRESS MONITOR/M-L CUFF) MISC, 1 Device by Does not apply route every morning., Disp: 1 each, Rfl: 0   EPINEPHrine 0.3 mg/0.3 mL IJ SOAJ injection, Inject 0.3 mg into the muscle as needed for anaphylaxis., Disp: 1 each, Rfl: 2   hydrochlorothiazide (HYDRODIURIL) 12.5 MG tablet, TAKE 1 TABLET BY MOUTH EVERY DAY, Disp: 30 tablet, Rfl: 5   No Known Allergies    The patient states she uses tubal ligation for birth control.  Patient's last menstrual period was 07/08/2021  (approximate).. Negative for Dysmenorrhea and Negative for Menorrhagia. Negative for: breast discharge, breast lump(s), breast pain and breast self exam. Associated symptoms include abnormal vaginal bleeding. Pertinent negatives include abnormal bleeding (hematology), anxiety, decreased libido, depression, difficulty falling sleep, dyspareunia, history of infertility, nocturia, sexual dysfunction, sleep disturbances, urinary incontinence, urinary urgency, vaginal discharge and vaginal itching. Diet regular.  The patient states her exercise level is minimal walking 2 days a week for 30 minutes.    The patient's tobacco use is:  Social History   Tobacco Use  Smoking Status Never  Smokeless Tobacco Never   She has been exposed to passive smoke. The patient's alcohol use is:  Social History   Substance and Sexual Activity  Alcohol Use No   Alcohol/week: 0.0 standard drinks   Additional information: Last pap 02/10/2019, next one scheduled for 02/09/2022.    Review of Systems  Constitutional: Negative.   HENT: Negative.    Eyes: Negative.   Respiratory: Negative.    Cardiovascular: Negative.  Negative for chest pain, palpitations and leg swelling.  Gastrointestinal: Negative.   Endocrine: Negative.   Genitourinary: Negative.   Musculoskeletal: Negative.   Skin: Negative.   Allergic/Immunologic: Negative.   Neurological: Negative.   Hematological: Negative.   Psychiatric/Behavioral: Negative.      Today's Vitals   07/24/21 1134  BP:  140/82  Pulse: 90  Temp: 98.2 F (36.8 C)  TempSrc: Oral  Weight: 221 lb (100.2 kg)  Height: 5' 4.4" (1.636 m)   Body mass index is 37.46 kg/m.   Objective:  Physical Exam Vitals reviewed.  Constitutional:      General: She is not in acute distress.    Appearance: Normal appearance. She is well-developed. She is obese.  HENT:     Head: Normocephalic and atraumatic.     Right Ear: Hearing, tympanic membrane, ear canal and external ear  normal. There is no impacted cerumen.     Left Ear: Hearing, tympanic membrane, ear canal and external ear normal. There is no impacted cerumen.     Nose:     Comments: Deferred - masked    Mouth/Throat:     Comments: Deferred - masked Eyes:     General: Lids are normal.     Extraocular Movements: Extraocular movements intact.     Conjunctiva/sclera: Conjunctivae normal.     Pupils: Pupils are equal, round, and reactive to light.     Funduscopic exam:    Right eye: No papilledema.        Left eye: No papilledema.  Neck:     Thyroid: No thyroid mass.     Vascular: No carotid bruit.  Cardiovascular:     Rate and Rhythm: Normal rate and regular rhythm.     Pulses: Normal pulses.     Heart sounds: Normal heart sounds. No murmur heard. Pulmonary:     Effort: Pulmonary effort is normal. No respiratory distress.     Breath sounds: Normal breath sounds. No wheezing.  Chest:     Chest wall: No mass.  Breasts:    Tanner Score is 5.     Right: Normal. No mass or tenderness.     Left: Normal. No mass or tenderness.  Abdominal:     General: Abdomen is flat. Bowel sounds are normal. There is no distension.     Palpations: Abdomen is soft.     Tenderness: There is no abdominal tenderness.  Genitourinary:    Rectum: Guaiac result negative.  Musculoskeletal:        General: No swelling. Normal range of motion.     Cervical back: Full passive range of motion without pain, normal range of motion and neck supple.     Right lower leg: No edema.     Left lower leg: No edema.  Lymphadenopathy:     Upper Body:     Right upper body: No supraclavicular, axillary or pectoral adenopathy.     Left upper body: No supraclavicular, axillary or pectoral adenopathy.  Skin:    General: Skin is warm and dry.     Capillary Refill: Capillary refill takes less than 2 seconds.  Neurological:     General: No focal deficit present.     Mental Status: She is alert and oriented to person, place, and time.      Cranial Nerves: No cranial nerve deficit.     Sensory: No sensory deficit.  Psychiatric:        Mood and Affect: Mood normal.        Behavior: Behavior normal.        Thought Content: Thought content normal.        Judgment: Judgment normal.        Assessment And Plan:     1. Encounter for general adult medical examination w/o abnormal findings  2. Essential hypertension Comments: Blood pressure is  fairly controlled, will start her on amlodipine, return in 4 -6 weeks f/u. EKG done with NSR HR 71 - POCT Urinalysis Dipstick (81002) - Microalbumin / Creatinine Urine Ratio - EKG 12-Lead  3. Class 2 severe obesity due to excess calories with serious comorbidity and body mass index (BMI) of 37.0 to 37.9 in adult [facility]) Chronic Discussed healthy diet and regular exercise options  Encouraged to exercise at least 150 minutes per week with 2 days of strength training Will start [facility] pending insurance approval she is to titrate weekly, discussed side effects of nausea, abdominal pain or difficulty swallowing to notify office. [facility] teaching done  Return in 2 months for weight check. we have started [facility], discussed side effects to include nausea, difficulty swallowing and abdominal pain. She is to titrate weekly as tolerated. Goal to lose 10% body weight in 4 months if approved by insurance F/u in 2 months  - Semaglutide-Weight Management (WEGOVY) 0.25 MG/0.5ML SOAJ; Inject 0.25 mg into the skin once a week.  Dispense: 2 mL; Refill: 0     Patient was given opportunity to ask questions. Patient verbalized understanding of the plan and was able to repeat key elements of the plan. All questions were answered to their satisfaction.   Minette Brine, FNP   I, Minette Brine, FNP, have reviewed all documentation for this visit. The documentation on 07/24/21 for the exam, diagnosis, procedures, and orders are all accurate and complete.  THE PATIENT IS ENCOURAGED TO PRACTICE SOCIAL DISTANCING DUE  TO THE COVID-19 PANDEMIC.

## 2021-07-25 LAB — POCT URINALYSIS DIPSTICK
Bilirubin, UA: NEGATIVE
Blood, UA: NEGATIVE
Glucose, UA: NEGATIVE
Ketones, UA: NEGATIVE
Leukocytes, UA: NEGATIVE
Nitrite, UA: NEGATIVE
Protein, UA: NEGATIVE
Spec Grav, UA: 1.02 (ref 1.010–1.025)
Urobilinogen, UA: 0.2 E.U./dL
pH, UA: 7 (ref 5.0–8.0)

## 2021-07-26 ENCOUNTER — Other Ambulatory Visit: Payer: Self-pay | Admitting: Nurse Practitioner

## 2021-09-25 ENCOUNTER — Ambulatory Visit: Payer: 59 | Admitting: Allergy

## 2021-10-07 ENCOUNTER — Other Ambulatory Visit: Payer: Self-pay | Admitting: Nurse Practitioner

## 2021-10-07 DIAGNOSIS — I10 Essential (primary) hypertension: Secondary | ICD-10-CM

## 2021-10-09 ENCOUNTER — Ambulatory Visit: Payer: 59 | Admitting: Nurse Practitioner

## 2021-11-09 ENCOUNTER — Other Ambulatory Visit: Payer: Self-pay | Admitting: Nurse Practitioner

## 2021-11-09 DIAGNOSIS — I10 Essential (primary) hypertension: Secondary | ICD-10-CM

## 2021-11-28 ENCOUNTER — Ambulatory Visit: Payer: 59 | Admitting: Nurse Practitioner

## 2021-12-05 ENCOUNTER — Encounter: Payer: Self-pay | Admitting: Nurse Practitioner

## 2021-12-05 ENCOUNTER — Ambulatory Visit: Payer: 59 | Admitting: Nurse Practitioner

## 2021-12-05 VITALS — BP 140/90 | HR 98 | Temp 98.5°F | Ht 64.0 in | Wt 213.0 lb

## 2021-12-05 DIAGNOSIS — Z6836 Body mass index (BMI) 36.0-36.9, adult: Secondary | ICD-10-CM | POA: Diagnosis not present

## 2021-12-05 DIAGNOSIS — I10 Essential (primary) hypertension: Secondary | ICD-10-CM | POA: Diagnosis not present

## 2021-12-05 MED ORDER — AMLODIPINE BESYLATE 10 MG PO TABS: 10.0000 mg | ORAL_TABLET | Freq: Every day | ORAL | 1 refills | Status: DC

## 2021-12-05 MED ORDER — HYDROCHLOROTHIAZIDE 12.5 MG PO TABS: 12.5000 mg | ORAL_TABLET | Freq: Every day | ORAL | 1 refills | Status: DC

## 2021-12-05 NOTE — Progress Notes (Signed)
Barnet [place] Martin,acting as a Education administrator for Minette Brine, FNP.,have documented all relevant documentation on the behalf of Minette Brine, FNP,as directed by  Minette Brine, FNP while in the presence of Minette Brine, [place].    Subjective:     Patient ID: Sherry Sampson , female    DOB: September 10, 1970 , 51 y.o.   MRN: [id number]   Chief Complaint  Patient presents with   Hypertension    HPI  Patient presents today for BP check. She drank caffeinated coffee this morning  BP Readings from Last 3 Encounters: 12/05/21 : (!) 140/90 07/24/21 : 140/82 07/16/21 : 130/70  She is walking every other day for 30 minutes - consistently for 2 weeks. She feels like her diet is better. She is on a "fruit diet"  Wt Readings from Last 3 Encounters: 12/05/21 : 213 lb (96.6 kg) 07/24/21 : 221 lb (100.2 kg) 07/16/21 : 209 lb (94.8 kg)      Hypertension This is a chronic problem. The current episode started more than 1 year ago. The problem is unchanged. The problem is controlled. Pertinent negatives include no anxiety, chest pain, headaches, palpitations or shortness of breath. There are no associated agents to hypertension. Risk factors for coronary artery disease include obesity and sedentary lifestyle. Past treatments include diuretics and calcium channel blockers. There is no history of angina or kidney disease. There is no history of chronic renal disease.     Past Medical History:  Diagnosis Date   Heart murmur    Hypertension    no meds.. pt states long time ago   Leiomyoma 2013   11x43m, 10x766m    Family History  Problem Relation Age of Onset   Diabetes Maternal Grandmother    Cancer Paternal Grandmother        COLON   Hypertension Father    Heart murmur Father    Heart murmur Daughter      Current Outpatient Medications:    Blood Pressure Monitoring (BLOOD PRESS MONITOR/M-L CUFF) MISC, 1 Device by Does not apply route every morning., Disp: 1 each, Rfl: 0   EPINEPHrine 0.3 mg/0.3 mL  IJ SOAJ injection, Inject 0.3 mg into the muscle as needed for anaphylaxis., Disp: 1 each, Rfl: 2   amLODipine (NORVASC) 10 MG tablet, Take 1 tablet (10 mg total) by mouth daily., Disp: 90 tablet, Rfl: 1   hydrochlorothiazide (HYDRODIURIL) 12.5 MG tablet, Take 1 tablet (12.5 mg total) by mouth daily., Disp: 90 tablet, Rfl: 1   No Known Allergies   Review of Systems  Constitutional: Negative.   HENT: Negative.    Eyes: Negative.   Respiratory: Negative.  Negative for shortness of breath.   Cardiovascular: Negative.  Negative for chest pain and palpitations.  Gastrointestinal: Negative.   Neurological:  Negative for headaches.  Psychiatric/Behavioral: Negative.       Today's Vitals   12/05/21 1002  BP: (!) 140/90  Pulse: 98  Temp: 98.5 F (36.9 C)  TempSrc: Oral  Weight: 213 lb (96.6 kg)  Height: 5' 4"  (1.626 m)  PainSc: 0-No pain   Body mass index is 36.56 kg/m.  Wt Readings from Last 3 Encounters:  12/05/21 213 lb (96.6 kg)  07/24/21 221 lb (100.2 kg)  07/16/21 209 lb (94.8 kg)    Objective:  Physical Exam Vitals reviewed.  Constitutional:      General: She is not in acute distress.    Appearance: Normal appearance.  Cardiovascular:     Rate and Rhythm: Normal rate  and regular rhythm.     Pulses: Normal pulses.     Heart sounds: Normal heart sounds. No murmur heard. Pulmonary:     Effort: Pulmonary effort is normal. No respiratory distress.     Breath sounds: Normal breath sounds. No wheezing.  Skin:    General: Skin is warm and dry.     Capillary Refill: Capillary refill takes less than 2 seconds.  Neurological:     General: No focal deficit present.     Mental Status: She is alert and oriented to person, place, and time.     Cranial Nerves: No cranial nerve deficit.         Assessment And Plan:     1. Essential hypertension Comments: Blood pressure is slightly elevated, advised to avoid drinking caffeinated coffee and watch for hidden salt in foods.  Continue current medications - BMP8+eGFR - hydrochlorothiazide (HYDRODIURIL) 12.5 MG tablet; Take 1 tablet (12.5 mg total) by mouth daily.  Dispense: 90 tablet; Refill: 1 - amLODipine (NORVASC) 10 MG tablet; Take 1 tablet (10 mg total) by mouth daily.  Dispense: 90 tablet; Refill: 1  2. Class 2 severe obesity due to excess calories with serious comorbidity and body mass index (BMI) of 36.0 to 36.9 in adult [facility]) She was not able to get the [facility] due to the [employer]. Continue focusing on healthy diet and exercsing regularly. She has lost 8 lbs since her last visit    Patient was given opportunity to ask questions. Patient verbalized understanding of the plan and was able to repeat key elements of the plan. All questions were answered to their satisfaction.  Minette Brine, FNP   I, Minette Brine, FNP, have reviewed all documentation for this visit. The documentation on 12/05/21 for the exam, diagnosis, procedures, and orders are all accurate and complete.   IF YOU HAVE BEEN REFERRED TO A SPECIALIST, IT MAY TAKE 1-2 WEEKS TO SCHEDULE/PROCESS THE REFERRAL. IF YOU HAVE NOT HEARD FROM US/SPECIALIST IN TWO WEEKS, PLEASE GIVE [place] A CALL AT [phone] X [phone].   THE PATIENT IS ENCOURAGED TO PRACTICE SOCIAL DISTANCING DUE TO THE COVID-19 PANDEMIC.

## 2021-12-05 NOTE — Patient Instructions (Signed)
Hypertension, Adult ?Hypertension is another name for high blood pressure. High blood pressure forces your heart to work harder to pump blood. This can cause problems over time. ?There are two numbers in a blood pressure reading. There is a top number (systolic) over a bottom number (diastolic). It is best to have a blood pressure that is below 120/80. ?What are the causes? ?The cause of this condition is not known. Some other conditions can lead to high blood pressure. ?What increases the risk? ?Some lifestyle factors can make you more likely to develop high blood pressure: ?Smoking. ?Not getting enough exercise or physical activity. ?Being overweight. ?Having too much fat, sugar, calories, or salt (sodium) in your diet. ?Drinking too much alcohol. ?Other risk factors include: ?Having any of these conditions: ?Heart disease. ?Diabetes. ?High cholesterol. ?Kidney disease. ?Obstructive sleep apnea. ?Having a family history of high blood pressure and high cholesterol. ?Age. The risk increases with age. ?Stress. ?What are the signs or symptoms? ?High blood pressure may not cause symptoms. Very high blood pressure (hypertensive crisis) may cause: ?Headache. ?Fast or uneven heartbeats (palpitations). ?Shortness of breath. ?Nosebleed. ?Vomiting or feeling like you may vomit (nauseous). ?Changes in how you see. ?Very bad chest pain. ?Feeling dizzy. ?Seizures. ?How is this treated? ?This condition is treated by making healthy lifestyle changes, such as: ?Eating healthy foods. ?Exercising more. ?Drinking less alcohol. ?Your doctor may prescribe medicine if lifestyle changes do not help enough and if: ?Your top number is above 130. ?Your bottom number is above 80. ?Your personal target blood pressure may vary. ?Follow these instructions at home: ?Eating and drinking ? ?If told, follow the DASH eating plan. To follow this plan: ?Fill one half of your plate at each meal with fruits and vegetables. ?Fill one fourth of your plate  at each meal with whole grains. Whole grains include whole-wheat pasta, brown rice, and whole-grain bread. ?Eat or drink low-fat dairy products, such as skim milk or low-fat yogurt. ?Fill one fourth of your plate at each meal with low-fat (lean) proteins. Low-fat proteins include fish, chicken without skin, eggs, beans, and tofu. ?Avoid fatty meat, cured and processed meat, or chicken with skin. ?Avoid pre-made or processed food. ?Limit the amount of salt in your diet to less than 1,500 mg each day. ?Do not drink alcohol if: ?Your doctor tells you not to drink. ?You are pregnant, may be pregnant, or are planning to become pregnant. ?If you drink alcohol: ?Limit how much you have to: ?0-1 drink a day for women. ?0-2 drinks a day for men. ?Know how much alcohol is in your drink. In the [place] one drink equals one 12 oz bottle of beer (355 mL), one 5 oz glass of wine (148 mL), or one 1? oz glass of hard liquor (44 mL). ?Lifestyle ? ?Work with your doctor to stay at a healthy weight or to lose weight. Ask your doctor what the best weight is for you. ?Get at least 30 minutes of exercise that causes your heart to beat faster (aerobic exercise) most days of the week. This may include walking, swimming, or biking. ?Get at least 30 minutes of exercise that strengthens your muscles (resistance exercise) at least 3 days a week. This may include lifting weights or doing Pilates. ?Do not smoke or use any products that contain nicotine or tobacco. If you need help quitting, ask your doctor. ?Check your blood pressure at home as told by your doctor. ?Keep all follow-up visits. ?Medicines ?Take over-the-counter and prescription medicines   only as told by your doctor. Follow directions carefully. ?Do not skip doses of blood pressure medicine. The medicine does not work as well if you skip doses. Skipping doses also puts you at risk for problems. ?Ask your doctor about side effects or reactions to medicines that you should watch  for. ?Contact a doctor if: ?You think you are having a reaction to the medicine you are taking. ?You have headaches that keep coming back. ?You feel dizzy. ?You have swelling in your ankles. ?You have trouble with your vision. ?Get help right away if: ?You get a very bad headache. ?You start to feel mixed up (confused). ?You feel weak or numb. ?You feel faint. ?You have very bad pain in your: ?Chest. ?Belly (abdomen). ?You vomit more than once. ?You have trouble breathing. ?These symptoms may be an emergency. Get help right away. Call 911. ?Do not wait to see if the symptoms will go away. ?Do not drive yourself to the hospital. ?Summary ?Hypertension is another name for high blood pressure. ?High blood pressure forces your heart to work harder to pump blood. ?For most people, a normal blood pressure is less than 120/80. ?Making healthy choices can help lower blood pressure. If your blood pressure does not get lower with healthy choices, you may need to take medicine. ?This information is not intended to replace advice given to you by your health care provider. Make sure you discuss any questions you have with your health care provider. ?Document Revised: 12/20/2020 Document Reviewed: 12/20/2020 ?Elsevier Patient Education ? 2023 Elsevier Inc. ? ?

## 2022-01-09 LAB — POCT ERYTHROCYTE SEDIMENTATION RATE, NON-AUTOMATED
Sed Rate: 12
Sed Rate: 52

## 2022-01-20 ENCOUNTER — Other Ambulatory Visit: Payer: Self-pay | Admitting: Nurse Practitioner

## 2022-01-20 DIAGNOSIS — I10 Essential (primary) hypertension: Secondary | ICD-10-CM

## 2022-01-20 MED ORDER — SPIRONOLACTONE 25 MG PO TABS: ORAL_TABLET | ORAL | 1 refills | Status: DC

## 2022-01-20 NOTE — Addendum Note (Signed)
Addended by: Minette Brine F on: 01/20/2022 04:01 PM   Modules accepted: Orders

## 2022-01-22 ENCOUNTER — Ambulatory Visit: Payer: 59 | Admitting: Nurse Practitioner

## 2022-01-22 ENCOUNTER — Encounter: Payer: Self-pay | Admitting: Nurse Practitioner

## 2022-01-22 VITALS — BP 118/84 | HR 70 | Temp 98.1°F | Ht 64.0 in | Wt 214.0 lb

## 2022-01-22 DIAGNOSIS — R2242 Localized swelling, mass and lump, left lower limb: Secondary | ICD-10-CM

## 2022-01-22 DIAGNOSIS — Z6836 Body mass index (BMI) 36.0-36.9, adult: Secondary | ICD-10-CM | POA: Diagnosis not present

## 2022-01-22 DIAGNOSIS — I10 Essential (primary) hypertension: Secondary | ICD-10-CM | POA: Diagnosis not present

## 2022-01-22 MED ORDER — SPIRONOLACTONE 25 MG PO TABS: ORAL_TABLET | ORAL | 2 refills | Status: DC

## 2022-01-22 NOTE — Patient Instructions (Signed)
Edema  Edema is an abnormal buildup of fluids in the body tissues and under the skin. Swelling of the legs, feet, and ankles is a common symptom that becomes more likely as you get older. Swelling is also common in looser tissues, such as around the eyes. Pressing on the area may make a temporary dent in your skin (pitting edema). This fluid may also accumulate in your lungs (pulmonary edema). There are many possible causes of edema. Eating too much salt (sodium) and being on your feet or sitting for a long time can cause edema in your legs, feet, and ankles. Common causes of edema include: Certain medical conditions, such as heart failure, liver or kidney disease, and cancer. Weak leg blood vessels. An injury. Pregnancy. Medicines. Being obese. Low protein levels in the blood. Hot weather may make edema worse. Edema is usually painless. Your skin may look swollen or shiny. Follow these instructions at home: Medicines Take over-the-counter and prescription medicines only as told by your health care provider. Your health care provider may prescribe a medicine to help your body get rid of extra water (diuretic). Take this medicine if you are told to take it. Eating and drinking Eat a low-salt (low-sodium) diet to reduce fluid as told by your health care provider. Sometimes, eating less salt may reduce swelling. Depending on the cause of your swelling, you may need to limit how much fluid you drink (fluid restriction). General instructions Raise (elevate) the injured area above the level of your heart while you are sitting or lying down. Do not sit still or stand for long periods of time. Do not wear tight clothing. Do not wear garters on your upper legs. Exercise your legs to get your circulation going. This helps to move the fluid back into your blood vessels, and it may help the swelling go down. Wear compression stockings as told by your health care provider. These stockings help to prevent  blood clots and reduce swelling in your legs. It is important that these are the correct size. These stockings should be prescribed by your health care provider to prevent possible injuries. If elastic bandages or wraps are recommended, use them as told by your health care provider. Contact a health care provider if: Your edema does not get better with treatment. You have heart, liver, or kidney disease and have symptoms of edema. You have sudden and unexplained weight gain. Get help right away if: You develop shortness of breath or chest pain. You cannot breathe when you lie down. You develop pain, redness, or warmth in the swollen areas. You have heart, liver, or kidney disease and suddenly get edema. You have a fever and your symptoms suddenly get worse. These symptoms may be an emergency. Get help right away. Call 911. Do not wait to see if the symptoms will go away. Do not drive yourself to the hospital. Summary Edema is an abnormal buildup of fluids in the body tissues and under the skin. Eating too much salt (sodium)and being on your feet or sitting for a long time can cause edema in your legs, feet, and ankles. Raise (elevate) the injured area above the level of your heart while you are sitting or lying down. Follow your health care provider's instructions about diet and how much fluid you can drink. This information is not intended to replace advice given to you by your health care provider. Make sure you discuss any questions you have with your health care provider. Document Revised: 11/05/2020 Document   Reviewed: 11/05/2020 Elsevier Patient Education  2023 Elsevier Inc.  

## 2022-01-22 NOTE — Progress Notes (Signed)
I,Victoria T Hamilton,acting as a Education administrator for Minette Brine, FNP.,have documented all relevant documentation on the behalf of Minette Brine, FNP,as directed by  Minette Brine, FNP while in the presence of Minette Brine, [place].   Subjective:     Patient ID: Sherry Sampson , female    DOB: 1970/08/17 , 51 y.o.   MRN: [id number]   Chief Complaint  Patient presents with   Foot Swelling    HPI  Pt presents today for swollen left foot.  She adds noticing this 2 weeks ago, foot did hurt at first pain has went away. The color initially was red, now the color is darkening.  Patient went to [facility], she was treated for gout which turns out it is not gout. At the ED she was told she may be retaining fluid. She was also treated with colchicine and prednisone. She has not been on any long trips. She has taken 12.5 mg of spironolactone and has improved slightly. Was painful but was unable to walk. Sore across the top. 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       Past Medical History:  Diagnosis Date   Heart murmur    Hypertension    no meds.. pt states long time ago   Leiomyoma 2013   11x50m, 10x7107m    Family History  Problem Relation Age of Onset   Diabetes Maternal Grandmother    Cancer Paternal Grandmother        COLON   Hypertension Father    Heart murmur Father    Heart murmur Daughter      Current Outpatient Medications:    amLODipine (NORVASC) 10 MG tablet, Take 1 tablet (10 mg total) by mouth daily., Disp: 90 tablet, Rfl: 1   Blood Pressure Monitoring (BLOOD PRESS MONITOR/M-L CUFF) MISC, 1 Device by Does not apply route every morning., Disp: 1 each, Rfl: 0   colchicine 0.6 MG tablet, PLEASE SEE ATTACHED FOR DETAILED DIRECTIONS, Disp: , Rfl:    EPINEPHrine 0.3 mg/0.3 mL IJ SOAJ injection, Inject 0.3 mg into the  muscle as needed for anaphylaxis., Disp: 1 each, Rfl: 2   predniSONE (DELTASONE) 10 MG tablet, TAKE 3 TABLETS EVERY DAY FOR 3 DAYS THEN 2 TABS FOR 2 DAYS THEN 1 TAB FOR 1 DAY, Disp: , Rfl:    doxycycline (VIBRAMYCIN) 100 MG capsule, Take 1 capsule by mouth 2 (two) times daily. (Patient not taking: Reported on 01/22/2022), Disp: , Rfl:    ketoconazole (NIZORAL) 2 % cream, APPLY TO RASH TWICE A DAY FOR 2 WEESKS (Patient not taking: Reported on 01/22/2022), Disp: , Rfl:    spironolactone (ALDACTONE) 25 MG tablet, Take 1/2 tablet daily by mouth in am, Disp: 30 tablet, Rfl: 2   No Known Allergies   Review of Systems  Constitutional: Negative.   Respiratory: Negative.    Cardiovascular: Negative.   Neurological: Negative.   Psychiatric/Behavioral: Negative.       Today's Vitals   01/22/22 1430  BP: 118/84  Pulse: 70  Temp: 98.1 F (36.7 C)  SpO2: 98%  Weight: 214 lb (97.1 kg)  Height: 5' 4" (1.626 m)   Body mass index is 36.73 kg/m.  Wt Readings from Last 3 Encounters:  01/22/22 214 lb (97.1 kg)  12/05/21 213 lb (96.6 kg)  07/24/21 221 lb (  100.2 kg)    Objective:  Physical Exam Vitals reviewed.  Constitutional:      General: She is not in acute distress.    Appearance: Normal appearance.  Cardiovascular:     Rate and Rhythm: Normal rate and regular rhythm.     Pulses: Normal pulses.     Heart sounds: Normal heart sounds. No murmur heard. Pulmonary:     Effort: Pulmonary effort is normal. No respiratory distress.     Breath sounds: Normal breath sounds. No wheezing.  Musculoskeletal:     Right lower leg: No edema.     Left lower leg: Edema (trace) present.  Skin:    General: Skin is warm and dry.     Capillary Refill: Capillary refill takes less than 2 seconds.  Neurological:     General: No focal deficit present.     Mental Status: She is alert and oriented to person, place, and time.     Cranial Nerves: No cranial nerve deficit.     Motor: No weakness.      Assessment And Plan:     1. Localized swelling of left foot Comments: Trace swelling to left foot, will check venous doppler with low suspicion of DVT however would like to rule out since persistent - VAS [place] LOWER EXTREMITY VENOUS (DVT); Future - Sed Rate (ESR)  2. Essential hypertension Comments: Blood pressure is fairly controlled, continue low salt diet. Will change from HCTZ to spironolactone this may help swelling too - spironolactone (ALDACTONE) 25 MG tablet; Take 1/2 tablet daily by mouth in am  Dispense: 30 tablet; Refill: 2  3. Class 2 severe obesity due to excess calories with serious comorbidity and body mass index (BMI) of 36.0 to 36.9 in adult [facility])  She is encouraged to strive for BMI less than 30 to decrease cardiac risk. Advised to aim for at least 150 minutes of exercise per week.   Patient was given opportunity to ask questions. Patient verbalized understanding of the plan and was able to repeat key elements of the plan. All questions were answered to their satisfaction.  Minette Brine, FNP   I, Minette Brine, FNP, have reviewed all documentation for this visit. The documentation on 01/22/22 for the exam, diagnosis, procedures, and orders are all accurate and complete.   IF YOU HAVE BEEN REFERRED TO A SPECIALIST, IT MAY TAKE 1-2 WEEKS TO SCHEDULE/PROCESS THE REFERRAL. IF YOU HAVE NOT HEARD FROM US/SPECIALIST IN TWO WEEKS, PLEASE GIVE [place] A CALL AT [phone].   THE PATIENT IS ENCOURAGED TO PRACTICE SOCIAL DISTANCING DUE TO THE COVID-19 PANDEMIC.

## 2022-01-23 ENCOUNTER — Ambulatory Visit (HOSPITAL_COMMUNITY)
Admission: RE | Admit: 2022-01-23 | Discharge: 2022-01-23 | Disposition: A | Discharge status: Home / Self Care | Payer: 59 | Source: Ambulatory Visit | Attending: Nurse Practitioner | Admitting: Nurse Practitioner

## 2022-01-23 DIAGNOSIS — R2242 Localized swelling, mass and lump, left lower limb: Secondary | ICD-10-CM | POA: Diagnosis present

## 2022-01-23 DIAGNOSIS — M109 Gout, unspecified: Secondary | ICD-10-CM | POA: Insufficient documentation

## 2022-01-23 LAB — SEDIMENTATION RATE: Sed Rate: 17 mm/hr (ref 0–32)

## 2022-02-04 ENCOUNTER — Other Ambulatory Visit: Payer: Self-pay

## 2022-02-10 ENCOUNTER — Other Ambulatory Visit: Payer: Self-pay

## 2022-02-10 MED ORDER — SPIRONOLACTONE 25 MG PO TABS: 25.0000 mg | ORAL_TABLET | Freq: Every day | ORAL | 2 refills | Status: DC

## 2022-02-26 ENCOUNTER — Ambulatory Visit (HOSPITAL_BASED_OUTPATIENT_CLINIC_OR_DEPARTMENT_OTHER): Payer: 59 | Admitting: Cardiovascular Disease

## 2022-02-26 ENCOUNTER — Encounter: Payer: Self-pay | Admitting: Podiatry

## 2022-02-26 ENCOUNTER — Ambulatory Visit (INDEPENDENT_AMBULATORY_CARE_PROVIDER_SITE_OTHER): Payer: 59

## 2022-02-26 ENCOUNTER — Ambulatory Visit: Payer: 59

## 2022-02-26 ENCOUNTER — Encounter (HOSPITAL_BASED_OUTPATIENT_CLINIC_OR_DEPARTMENT_OTHER): Payer: Self-pay | Admitting: Cardiovascular Disease

## 2022-02-26 ENCOUNTER — Ambulatory Visit: Payer: 59 | Admitting: Podiatry

## 2022-02-26 VITALS — BP 138/80 | HR 66

## 2022-02-26 VITALS — BP 128/76 | HR 72 | Ht 64.0 in | Wt 216.9 lb

## 2022-02-26 DIAGNOSIS — S9032XA Contusion of left foot, initial encounter: Secondary | ICD-10-CM

## 2022-02-26 DIAGNOSIS — M778 Other enthesopathies, not elsewhere classified: Secondary | ICD-10-CM

## 2022-02-26 DIAGNOSIS — R600 Localized edema: Secondary | ICD-10-CM | POA: Diagnosis not present

## 2022-02-26 DIAGNOSIS — I10 Essential (primary) hypertension: Secondary | ICD-10-CM

## 2022-02-26 MED ORDER — METHYLPREDNISOLONE 4 MG PO TBPK: ORAL_TABLET | ORAL | 0 refills | Status: DC

## 2022-02-26 NOTE — Patient Instructions (Signed)
Medication Instructions:  Your Physician recommend you continue on your current medication as directed.    Testing/Procedures: Your physician has requested that you have an echocardiogram. Echocardiography is a painless test that uses sound waves to create images of your heart. It provides your doctor with information about the size and shape of your heart and how well your heart's chambers and valves are working. This procedure takes approximately one hour. There are no restrictions for this procedure. Please do NOT wear cologne, perfume, aftershave, or lotions (deodorant is allowed). Please arrive 15 minutes prior to your appointment time.  Follow-Up: Please follow up in 3 months with Dr. Oval Linsey    Special Instructions:  Exercise recommendations: The American Heart Association recommends 150 minutes of moderate intensity exercise weekly. Try 30 minutes of moderate intensity exercise 4-5 times per week. This could include walking, jogging, or swimming.  Sodium Reduction Because sodium is in so many foods, it can be a challenge to monitor. You learn about limiting your amount of salt to 2 grams (2000 milligrams) daily. Try to read food labels and watch serving sizes in order to make this important lifestyle change. To view the content, go to this web address: https://pe.elsevier.com/mqwvw9s  This video will expire on: 11/20/2023. If you need access to this video following this date, please reach out to the healthcare provider who assigned it to you. This information is not intended to replace advice given to you by your health care provider. Make sure you discuss any questions you have with your health care provider. Elsevier Patient Education  [place].  How to Use Compression Stockings  Compression stockings are elastic socks that help increase blood flow (circulation) to the legs, decrease swelling in the legs, and reduce the chance of developing blood clots in the lower  legs. Compression stockings squeeze or apply pressure to the legs. The stockings are graduated, meaning the highest amount of pressure occurs at the toes and it decreases going toward the upper part of the leg. This helps ensure proper circulation through the veins. Compression stockings are often used by people who: Are recovering from surgery. The stockings help prevent blood clots after surgery. Have poor circulation or swelling in their legs because of a medical condition, such as chronic venous insufficiency, venous stasis, or lymphedema. Have a history of getting blood clots in their legs. Have bulging (varicose) veins. Sit or stay in bed for long periods of time (immobilization). Stand for long periods of time and experience leg pain or fatigue. Follow instructions from your health care provider about how and when to wear your compression stockings. What are the risks? Generally, compression stockings are safe to wear. However, problems may occur for some people, such as: The stockings being ineffective at increasing the circulation to the legs, decreasing swelling in the legs, or reducing the chance of developing blood clots in the lower legs. Skin complications, including breaks in the skin, open wounds, blisters, or dermatitis. How to wear compression stockings Before you put on your compression stockings: Make sure that they are the correct size and degree of compression. If you do not know your size or required grade of compression, ask your health care provider and follow the manufacturer's instructions that come with the stockings. Be sure they are the appropriate length for your medical needs. Compression stockings come in different lengths, including knee high, thigh high, and even up to the waist. Make sure that the stockings are clean, dry, and in good condition. Check  the stockings for rips and tears. Do not put them on if they are ripped or torn. Put your stockings on first  thing in the morning, before you get out of bed. Keep them on for as long as your health care provider advises. Most people are told to remove their compression stockings at the end of the day before bed. When you are wearing your stockings: Keep them as smooth as possible. Do not allow them to bunch up. It is especially important to prevent the stockings from bunching up around your toes or behind your knees. Make sure that the toe holes are underneath the toes and the heel patches are positioned at the heels. Do not roll the stockings downward and leave them rolled down. This can decrease blood flow to your legs. Change the stockings right away if they become wet or dirty or if they have a bad smell. If you have chronic leg wounds, make sure the wounds are properly covered or dressed before putting on your compression stockings. When you take off your stockings, check your legs and feet for: Open sores. Red spots or other areas of discoloration. Swelling. General tips Do not stop wearing compression stockings. Talk to your health care provider if your stockings feel too tight. Wash your stockings often with mild detergent in cold or warm water. Also wash them whenever they get dirty or have a bad smell. Do not use bleach. Air-dry your stockings or dry them in a clothes dryer on low heat. It may be helpful to have two pairs so that you have a pair to wear while the other is being washed. Replace your stockings every 3-6 months. If skin moisturizing is part of your treatment plan, apply lotion or cream at night so that your skin will be dry when you put on the stockings in the morning. It is harder to put the stockings on when you have lotion on your legs or feet. Wear nonskid shoes or slip-resistant socks when walking while wearing compression stockings. If you have difficulty putting on or taking off the compression stockings, ask your health care provider about devices that may help make this  easier. Contact a health care provider and remove your stockings if: You have a prickling or tingling feeling in your feet or legs. You have new open sores, red spots, or other skin changes on your feet or legs. You have swelling or pain that gets worse. Get help right away if: You have shortness of breath or chest pain. Your heartbeat is fast or irregular. You have new swelling, pain, or warmth in your leg. You have numbness or tingling in your lower legs that does not get better after you take the stockings off. Your toes or feet are unusually cold or turn a bluish color. You feel light-headed or dizzy. These symptoms may represent a serious problem that is an emergency. Do not wait to see if the symptoms will go away. Get medical help right away. Call your local emergency services (911 in the [place] Do not drive yourself to the hospital. Summary Compression stockings are elastic socks that are worn to treat a variety of symptoms and medical conditions such as venous insufficiency, venous stasis, or lymphedema. Compression stockings help increase blood flow (circulation) to the legs, decrease swelling in the legs, and reduce the chance of developing blood clots in the lower legs. Follow instructions from your health care provider about how and when to wear your compression stockings. Do not stop  wearing your compression stockings without talking to your health care provider first. This information is not intended to replace advice given to you by your health care provider. Make sure you discuss any questions you have with your health care provider. Document Revised: 08/23/2020 Document Reviewed: 08/23/2020 Elsevier Patient Education  [place].

## 2022-02-26 NOTE — Addendum Note (Signed)
Addended by: Alvina Filbert B on: 02/26/2022 03:21 PM   Modules accepted: Orders

## 2022-02-26 NOTE — Assessment & Plan Note (Signed)
She has lower extremity edema on the left.  I suspect this is due to venous insufficiency in setting of chronically walking on concrete floors at work.  Symptoms have improved since she started using a compression sock.  Given that it is not bilateral, it is much less likely that this is due to heart failure.  Continue wearing compression socks.  Will get an echo given her history of hypertension and to make sure we are not missing anything.

## 2022-02-26 NOTE — Assessment & Plan Note (Signed)
Blood pressure was initially elevated but better on repeat.  Her goal is less than 130/80.  We did discuss the importance of trying to work on some lifestyle modification.  Recommended that she start trying to meal prep as she gets home late from work but does have at least 3 to 4 days off per week.  Also recommended that she get some dedicated exercise with at least 150 minutes weekly.  I think with these interventions she will be able to continue her current regimen if not reduce her medications.  Continue amlodipine and spironolactone.  HCTZ was discontinued due to concerns for potentially causing gout.  She does not have gout and it could certainly be used again in the future if necessary.  She has had some hypokalemia so I think it makes sense to stay with the spironolactone for now.

## 2022-02-26 NOTE — Progress Notes (Signed)
[facility] Initial Assessment:    Date:  02/26/2022   ID:  Sherry Sampson, DOB 1970/06/22, MRN [id number]  PCP:  Minette Brine, FNP  Cardiologist:  None  Nephrologist:  Referring MD: Minette Brine, FNP   CC: Hypertension  History of Present Illness:    Sherry Sampson is a 51 y.o. female with a hx of heart murmur, hypertension, here to establish care in the [facility]. She saw her PCP 01/2022 and her blood pressure was uncontrolled. She also had mild edema and spironolactone was added to her regimen.   Today, she reports that in October she woke up one day and suddenly noticed her left foot was swollen (currently improved with compression socks but still present). She denies any prior ankle injuries. She saw her PCP with workup negative for PE. She also had some pain but her uric acid levels were normal. Her HCTZ was switched to spironolactone due to concern for possible gout. Since starting spironolactone she has not noticed any significant difference in her blood pressures. She confirms having hypertension for 5 years. More recently it has become difficult to control. At home she usually sees readings such as 120s-130s/80s, similar to today's in clinic reading 128/85. Normally she takes Amlodipine when she first wakes up, and her other medication at lunch time. 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 She does not formally exercise. Typically she orders out as she often works late 4 days out of the week, or 3 days on alternating weeks. She denies much caffeine in her diet, and does not consume alcohol. For pain management she would take Aleve, but only needs this infrequently. She has been told she snores, and mostly feels well rested in the morning. She would fall asleep very quickly if she stopped to watch a movie or read a book. She denies any palpitations, chest pain, or shortness of breath. No lightheadedness,  headaches, syncope, orthopnea, or PND.  Previous antihypertensives: HCTZ - Tolerated, switched due to concern for gout   Past Medical History:  Diagnosis Date   Heart murmur    Hypertension    no meds.. pt states long time ago   Leiomyoma 2013   11x43m, 10x735m  Lower extremity edema 02/26/2022    Past Surgical History:  Procedure Laterality Date   BACK SURGERY     BREAST SURGERY     BREAST REDUCTION   DILITATION & CURRETTAGE/HYSTROSCOPY WITH NOVASURE ABLATION Bilateral 12/05/2014   Procedure: DILATATION & CURETTAGE/HYSTEROSCOPY WITH NOVASURE ABLATION;  Surgeon: TiAnastasio AuerbachMD;  [facility];  Service: Gynecology;  Laterality: Bilateral;   HYSTEROSCOPY     D & C   LAPAROSCOPIC BILATERAL SALPINGECTOMY Bilateral 12/05/2014   Procedure: LAPAROSCOPIC BILATERAL SALPINGECTOMY AND FULGURATION OF ENDOMETRIOSIS;  Surgeon: TiAnastasio AuerbachMD;  [facility];  Service: Gynecology;  Laterality: Bilateral;    Current Medications: Current Meds  Medication Sig   amLODipine (NORVASC) 10 MG tablet Take 1 tablet (10 mg total) by mouth daily.   Blood Pressure Monitoring (BLOOD PRESS MONITOR/M-L CUFF) MISC 1 Device by Does not apply route every morning.   colchicine 0.6 MG tablet PLEASE SEE ATTACHED FOR DETAILED DIRECTIONS   EPINEPHrine 0.3 mg/0.3 mL IJ SOAJ injection Inject 0.3 mg into the muscle as needed for anaphylaxis.   methylPREDNISolone (MEDROL DOSEPAK) 4 MG TBPK tablet 6 day dose pack - take as directed   spironolactone (ALDACTONE) 25 MG tablet Take 1 tablet (25 mg  total) by mouth daily.     Allergies:   Shellfish allergy   Social History   Socioeconomic History   Marital status: Single    Spouse name: Not on file   Number of children: 1   Years of education: Not on file   Highest education level: Not on file  Occupational History   Not on file  Tobacco Use   Smoking status: Never   Smokeless tobacco: Never  Vaping Use   Vaping Use: Never used  Substance  and Sexual Activity   Alcohol use: No    Alcohol/week: 0.0 standard drinks of alcohol   Drug use: No   Sexual activity: Not Currently    Partners: Male    Birth control/protection: Surgical    Comment: interourse age 66, sexual partners more than 5--Tubes removed  Other Topics Concern   Not on file  Social History Narrative   Not on file   Social Determinants of Health   Financial Resource Strain: Low Risk  (02/26/2022)   Overall Financial Resource Strain (CARDIA)    Difficulty of Paying Living Expenses: Not hard at all  Food Insecurity: No Food Insecurity (02/26/2022)   Hunger Vital Sign    Worried About Running Out of Food in the Last Year: Never true    [place] in the Last Year: Never true  Transportation Needs: No Transportation Needs (02/26/2022)   PRAPARE - Hydrologist (Medical): No    Lack of Transportation (Non-Medical): No  Physical Activity: Inactive (02/26/2022)   Exercise Vital Sign    Days of Exercise per Week: 0 days    Minutes of Exercise per Session: 0 min  Stress: Not on file  Social Connections: Not on file     Family History: The patient's family history includes Cancer in her paternal grandmother; Diabetes in her maternal grandmother; Heart attack in her maternal grandmother; Heart murmur in her daughter and father; Hypertension in her father; Stroke in her father.  ROS:   Please see the history of present illness.    (+) LLE edema to the ankle (+) Snoring All other systems reviewed and are negative.  EKGs/Labs/Other Studies Reviewed:    LE Venous Doppler  01/23/2022: Summary:  RIGHT:  - No evidence of common femoral vein obstruction.    LEFT:  - No evidence of deep vein thrombosis in the lower extremity. No indirect  evidence of obstruction proximal to the inguinal ligament.  - No cystic structure found in the popliteal fossa.  - Mild superficial edema noted around the foot.   EKG:  EKG is personally  reviewed. 02/26/2022: Sinus rhythm. Rate 72 bpm.  Recent Labs: 07/16/2021: BUN 12; Creatinine, Ser 0.83; Hemoglobin 13.6; Platelets 315; Potassium 3.4; Sodium 141   Recent Lipid Panel    Component Value Date/Time   CHOL 125 04/20/2018 1647   TRIG 89 04/20/2018 1647   HDL 50 04/20/2018 1647   CHOLHDL 2.5 04/20/2018 1647   CHOLHDL 2.5 11/12/2017 1256   VLDL 11 04/28/2014 1210   LDLCALC 57 04/20/2018 1647   LDLCALC 67 11/12/2017 1256    Physical Exam:    VS:  BP 128/76 (BP Location: Right Arm, Patient Position: Sitting, Cuff Size: Large)   Pulse 72   Ht '5\' 4"'$  (1.626 m)   Wt 216 lb 14.4 oz (98.4 kg)   BMI 37.23 kg/m  , BMI Body mass index is 37.23 kg/m. GENERAL:  Well appearing HEENT: Pupils equal round and reactive,  fundi not visualized, oral mucosa unremarkable NECK:  No jugular venous distention, waveform within normal limits, carotid upstroke brisk and symmetric, no bruits, no thyromegaly LUNGS:  Clear to auscultation bilaterally HEART:  RRR.  PMI not displaced or sustained,S1 and S2 within normal limits, no S3, no S4, no clicks, no rubs, no murmurs ABD:  Flat, positive bowel sounds normal in frequency in pitch, no bruits, no rebound, no guarding, no midline pulsatile mass, no hepatomegaly, no splenomegaly EXT:  2 plus pulses throughout, no edema, no cyanosis no clubbing SKIN:  No rashes no nodules NEURO:  Cranial nerves II through XII grossly intact, motor grossly intact throughout PSYCH:  Cognitively intact, oriented to person place and time  ASSESSMENT/PLAN:    Essential hypertension Blood pressure was initially elevated but better on repeat.  Her goal is less than 130/80.  We did discuss the importance of trying to work on some lifestyle modification.  Recommended that she start trying to meal prep as she gets home late from work but does have at least 3 to 4 days off per week.  Also recommended that she get some dedicated exercise with at least 150 minutes weekly.  I  think with these interventions she will be able to continue her current regimen if not reduce her medications.  Continue amlodipine and spironolactone.  HCTZ was discontinued due to concerns for potentially causing gout.  She does not have gout and it could certainly be used again in the future if necessary.  She has had some hypokalemia so I think it makes sense to stay with the spironolactone for now.  Lower extremity edema She has lower extremity edema on the left.  I suspect this is due to venous insufficiency in setting of chronically walking on concrete floors at work.  Symptoms have improved since she started using a compression sock.  Given that it is not bilateral, it is much less likely that this is due to heart failure.  Continue wearing compression socks.  Will get an echo given her history of hypertension and to make sure we are not missing anything.   Screening for Secondary Hypertension:     02/26/2022    2:57 PM  Causes  Drugs/Herbals Screened     - Comments +salt.  No caffeine or EtOH.  +Aleve  Renovascular HTN N/A  Sleep Apnea Screened     - Comments snores. +daytime somnolence.  not sleepy in AM  Thyroid Disease Screened  Hyperaldosteronism N/A  Pheochromocytoma N/A  Cushing's Syndrome N/A  Hyperparathyroidism N/A  Coarctation of the Aorta Screened     - Comments BP symmetric  Compliance Screened    Relevant Labs/Studies:    Latest Ref Rng & Units 07/16/2021    4:14 PM 04/17/2020    4:18 PM 09/15/2019    5:31 PM  Basic Labs  Sodium 134 - 144 mmol/L 141  136  139   Potassium 3.5 - 5.2 mmol/L 3.4  3.3  4.1   Creatinine 0.57 - 1.00 mg/dL 0.83  0.86  0.85        Latest Ref Rng & Units 05/08/2020   12:00 AM 11/12/2017   12:56 PM  Thyroid   TSH 0.41 - 5.90 1.59  1.33     Disposition:    FU with Tayloranne Lekas C. Oval Linsey, MD, [facility] in 3 months.  Medication Adjustments/Labs and Tests Ordered: Current medicines are reviewed at length with the patient today.  Concerns  regarding medicines are outlined above.   Orders Placed This  Encounter  Procedures   EKG 12-Lead   No orders of the defined types were placed in this encounter.  I,Mathew Stumpf,acting as a Education administrator for Skeet Latch, MD.,have documented all relevant documentation on the behalf of Skeet Latch, MD,as directed by  Skeet Latch, MD while in the presence of Skeet Latch, MD.  I, [place] Oval Linsey, MD have reviewed all documentation for this visit.  The documentation of the exam, diagnosis, procedures, and orders on 02/26/2022 are all accurate and complete.  Signed, Skeet Latch, MD  02/26/2022 3:14 PM    [facility]

## 2022-02-26 NOTE — Progress Notes (Signed)
Subjective:  Patient ID: Sherry Sampson, female    DOB: 19-Dec-1970,  MRN: [id number] HPI Chief Complaint  Patient presents with   Foot Pain    Dorsal foot left - woke up on 01/06/22 and foot was very swollen, tender and discolored, thought initially was bit by a spider, went to Urgent Care-thought had athletes feet-Rx'd cream and oral meds-no better, no help, went back and was referred to [facility]- thought gout, Rx'd meds-didn't get better, did a teledoc visit-Rx'd colchicine-started to get better, but still some swelling, went back to [facility] for follow up - out of work for 2 weeks-did get some better, but hasn't completely healed   New Patient (Initial Visit)    Sherry Sampson brought from Urgent Care and also had labs done and all were normal, cardiologist appt today to R/O blood clot/heart disorder    51 y.o. female presents with the above complaint.   ROS: Denies fever chills nausea vomit muscle aches pains calf pain back pain chest pain shortness of breath.  Past Medical History:  Diagnosis Date   Heart murmur    Hypertension    no meds.. pt states long time ago   Leiomyoma 2013   11x67m, 10x719m  Past Surgical History:  Procedure Laterality Date   BACK SURGERY     BREAST SURGERY     BREAST REDUCTION   DILITATION & CURRETTAGE/HYSTROSCOPY WITH NOVASURE ABLATION Bilateral 12/05/2014   Procedure: DILATATION & CURETTAGE/HYSTEROSCOPY WITH NOVASURE ABLATION;  Surgeon: TiAnastasio AuerbachMD;  [facility];  Service: Gynecology;  Laterality: Bilateral;   HYSTEROSCOPY     D & C   LAPAROSCOPIC BILATERAL SALPINGECTOMY Bilateral 12/05/2014   Procedure: LAPAROSCOPIC BILATERAL SALPINGECTOMY AND FULGURATION OF ENDOMETRIOSIS;  Surgeon: TiAnastasio AuerbachMD;  [facility];  Service: Gynecology;  Laterality: Bilateral;    Current Outpatient Medications:    methylPREDNISolone (MEDROL DOSEPAK) 4 MG TBPK tablet, 6 day dose pack - take as directed, Disp: 21 tablet, Rfl: 0    spironolactone (ALDACTONE) 25 MG tablet, Take 1 tablet (25 mg total) by mouth daily., Disp: 30 tablet, Rfl: 2   amLODipine (NORVASC) 10 MG tablet, Take 1 tablet (10 mg total) by mouth daily., Disp: 90 tablet, Rfl: 1   Blood Pressure Monitoring (BLOOD PRESS MONITOR/M-L CUFF) MISC, 1 Device by Does not apply route every morning., Disp: 1 each, Rfl: 0   colchicine 0.6 MG tablet, PLEASE SEE ATTACHED FOR DETAILED DIRECTIONS, Disp: , Rfl:    EPINEPHrine 0.3 mg/0.3 mL IJ SOAJ injection, Inject 0.3 mg into the muscle as needed for anaphylaxis., Disp: 1 each, Rfl: 2  Allergies  Allergen Reactions   Shellfish Allergy Swelling   Review of Systems Objective:   Vitals:   02/26/22 0921  BP: 138/80  Pulse: 66    General: Well developed, nourished, in no acute distress, alert and oriented x3   Dermatological: Skin is warm, dry and supple bilateral. Nails x 10 are well maintained; remaining integument appears unremarkable at this time. There are no open sores, no preulcerative lesions, no rash or signs of infection present.  Vascular: Dorsalis Pedis artery and Posterior Tibial artery pedal pulses are 2/4 bilateral with immedate capillary fill time. Pedal hair growth present. No varicosities and no lower extremity edema present bilateral.   Neruologic: Grossly intact via light touch bilateral. Vibratory intact via tuning fork bilateral. Protective threshold with Semmes Wienstein monofilament intact to all pedal sites bilateral. Patellar and Achilles deep tendon reflexes 2+ bilateral. No Babinski or clonus  noted bilateral.   Musculoskeletal: No gross boney pedal deformities bilateral. No pain, crepitus, or limitation noted with foot and ankle range of motion bilateral. Muscular strength 5/5 in all groups tested bilateral.  Swollen forefoot left no palpable masses identified.  She has no significant pain on end range of motion of the metatarsal phalangeal joints but exquisite pain on palpation of the  metatarsal phalangeal joint as well as the second metatarsal diaphyseal region.  She has no palpable neuromas.  Reviewed previous radiographs which did not demonstrate any type of osseous abnormalities.  Gait: Unassisted, Nonantalgic.    Radiographs:  Radiographs were taken today as well again not demonstrating any type of osseous abnormalities other than some possible thickening in the mid diaphyseal region of the second metatarsal it appears to be endosteal.  Assessment & Plan:   Assessment: Cannot rule out endosteal stress reaction or capsulitis at the metatarsal phalangeal joint.  Cannot rule out seropositive type arthropathy.  Plan: This patient has had multiple sets of x-rays by multiple specialties including emergency med urgent care orthopedics and now podiatry she has been on antibiotics steroids nonsteroidals and immobilization.  All of these have failed to render the patient asymptomatic.  Blood work does not demonstrate any seropositive arthropathy.  Since pain seems to be increasing and nothing has alleviated her symptomatology I think MRI of the forefoot be necessary to rule out any type of anatomical abnormalities.  This will be for differential diagnoses and surgical evaluation.  Also put her back on steroids and recommended compression.     Venson Ferencz T. [place], [place]

## 2022-03-21 ENCOUNTER — Ambulatory Visit (INDEPENDENT_AMBULATORY_CARE_PROVIDER_SITE_OTHER): Payer: 59

## 2022-03-21 DIAGNOSIS — R600 Localized edema: Secondary | ICD-10-CM | POA: Diagnosis not present

## 2022-03-21 DIAGNOSIS — I10 Essential (primary) hypertension: Secondary | ICD-10-CM | POA: Diagnosis not present

## 2022-03-21 LAB — ECHOCARDIOGRAM COMPLETE
AR max vel: 1.79 cm2
AV Area VTI: 1.7 cm2
AV Area mean vel: 1.6 cm2
AV Mean grad: 5 mmHg
AV Peak grad: 7.7 mmHg
Ao pk vel: 1.39 m/s
Area-P 1/2: 3.81 cm2
S' Lateral: 2.63 cm
Single Plane A4C EF: 63.4 %

## 2022-04-04 ENCOUNTER — Ambulatory Visit
Admission: RE | Admit: 2022-04-04 | Discharge: 2022-04-04 | Disposition: A | Discharge status: Home / Self Care | Payer: 59 | Source: Ambulatory Visit | Attending: Podiatry | Admitting: Podiatry

## 2022-04-04 DIAGNOSIS — M778 Other enthesopathies, not elsewhere classified: Secondary | ICD-10-CM

## 2022-04-07 ENCOUNTER — Ambulatory Visit: Payer: 59 | Admitting: Nurse Practitioner

## 2022-04-09 ENCOUNTER — Telehealth: Payer: Self-pay | Admitting: *Deleted

## 2022-04-09 NOTE — Telephone Encounter (Signed)
Contacted patient to update that the MRI is going for an overread , apologized for the delay.

## 2022-04-16 ENCOUNTER — Telehealth: Payer: Self-pay | Admitting: *Deleted

## 2022-04-16 NOTE — Telephone Encounter (Signed)
Patient is calling for an update on over read 2nd opinion from [facility], reached out to them and they did not received the report with the disc, resent the request to [facility] for them to send, updated the patient trhru voice mail. she is still having a little great toe pain ,foot swelling and discoloration. Please advise.

## 2022-04-22 ENCOUNTER — Encounter: Payer: Self-pay | Admitting: Nurse Practitioner

## 2022-04-28 ENCOUNTER — Telehealth: Payer: Self-pay

## 2022-04-28 NOTE — Telephone Encounter (Signed)
okay

## 2022-05-08 ENCOUNTER — Ambulatory Visit: Payer: 59 | Admitting: Podiatry

## 2022-05-11 ENCOUNTER — Other Ambulatory Visit: Payer: Self-pay | Admitting: Nurse Practitioner

## 2022-05-13 ENCOUNTER — Ambulatory Visit: Payer: 59 | Admitting: Podiatry

## 2022-05-13 DIAGNOSIS — M778 Other enthesopathies, not elsewhere classified: Secondary | ICD-10-CM | POA: Diagnosis not present

## 2022-05-13 MED ORDER — DEXAMETHASONE SODIUM PHOSPHATE 120 MG/30ML IJ SOLN: 2.0000 mg | Freq: Once | INTRAMUSCULAR | Status: AC

## 2022-05-13 MED ORDER — MELOXICAM 15 MG PO TABS: 15.0000 mg | ORAL_TABLET | Freq: Every day | ORAL | 3 refills | Status: DC

## 2022-05-13 NOTE — Progress Notes (Signed)
She presents today with her husband for follow-up of her MRI results.  States that swelling and is painful for her left foot.  She states that really has not improved at all.  She presents today in a pair of crocs.  She states that the pain is about a 6 out of 10.  Objective: Vital signs are stable alert oriented x 3.  There is no erythema to some mild edema no cellulitis drainage or odor she has good range of motion though it is markedly tender on dorsiflexion.  MRI does demonstrate osteochondral lesion with some osteoarthritis of the first metatarsal phalangeal joint and sesamoidal arthritis or capsulitis first metatarsophalangeal joint left.  Assessment: Capsulitis osteoarthritis.  Plan: At this point I injected after sterile Betadine skin prep 4 mg of dexamethasone local anesthetic she tolerated procedure well without complication follow-up with her in a month.  We did discuss possibly injecting with a different medication.  She will continue with her meloxicam.

## 2022-05-16 ENCOUNTER — Ambulatory Visit (INDEPENDENT_AMBULATORY_CARE_PROVIDER_SITE_OTHER): Payer: 59

## 2022-05-16 DIAGNOSIS — M778 Other enthesopathies, not elsewhere classified: Secondary | ICD-10-CM

## 2022-05-16 NOTE — Progress Notes (Signed)
Patient presents today to be casted for custom molded orthotics. HYATT is the treating physician.  Impression foam cast was taken. ABN signed.  Patient info-  Shoe size: 9W  Shoe style: ATHLETIC  Height: 5FT 4IN  Weight: 214LBS  Insurance: [employer]   Patient will be notified once orthotics arrive in office and reappoint for fitting at that time.

## 2022-06-20 ENCOUNTER — Ambulatory Visit (HOSPITAL_BASED_OUTPATIENT_CLINIC_OR_DEPARTMENT_OTHER): Payer: 59 | Admitting: Cardiovascular Disease

## 2022-07-08 NOTE — Addendum Note (Signed)
Addended by: Monalisa Bayless E on: 07/08/2022 02:09 PM   Modules accepted: Orders  

## 2022-07-29 ENCOUNTER — Encounter: Payer: 59 | Admitting: Nurse Practitioner

## 2022-07-29 NOTE — Progress Notes (Deleted)
Hershal Coria Katera Rybka,acting as a Neurosurgeon for Arnette Felts, FNP.,have documented all relevant documentation on the behalf of Arnette Felts, FNP,as directed by  Arnette Felts, FNP while in the presence of Arnette Felts, FNP.   Subjective:     Patient ID: Sherry Sampson , female    DOB: Jun 23, 1970 , 52 y.o.   MRN: [id number]   No chief complaint on file.   HPI  Patient presents today for HM, patient states compliance with medications and has no other concerns today.     Past Medical History:  Diagnosis Date  . Heart murmur   . Hypertension    no meds.. pt states long time ago  . Leiomyoma 2013   11x5mm, 10x62mm  . Lower extremity edema 02/26/2022     Family History  Problem Relation Age of Onset  . Hypertension Father   . Heart murmur Father   . Stroke Father   . Heart attack Maternal Grandmother   . Diabetes Maternal Grandmother   . Cancer Paternal Grandmother        COLON  . Heart murmur Daughter      Current Outpatient Medications:  .  amLODipine (NORVASC) 10 MG tablet, Take 1 tablet (10 mg total) by mouth daily., Disp: 90 tablet, Rfl: 1 .  Blood Pressure Monitoring (BLOOD PRESS MONITOR/M-L CUFF) MISC, 1 Device by Does not apply route every morning., Disp: 1 each, Rfl: 0 .  colchicine 0.6 MG tablet, PLEASE SEE ATTACHED FOR DETAILED DIRECTIONS, Disp: , Rfl:  .  EPINEPHrine 0.3 mg/0.3 mL IJ SOAJ injection, Inject 0.3 mg into the muscle as needed for anaphylaxis., Disp: 1 each, Rfl: 2 .  meloxicam (MOBIC) 15 MG tablet, Take 1 tablet (15 mg total) by mouth daily., Disp: 30 tablet, Rfl: 3 .  methylPREDNISolone (MEDROL DOSEPAK) 4 MG TBPK tablet, 6 day dose pack - take as directed, Disp: 21 tablet, Rfl: 0 .  spironolactone (ALDACTONE) 25 MG tablet, TAKE 1 TABLET (25 MG TOTAL) BY MOUTH DAILY., Disp: 30 tablet, Rfl: 2  Current Facility-Administered Medications:  .  dexamethasone (DECADRON) injection 2 mg, 2 mg, Intra-articular, Once, Hyatt, Max T, DPM   Allergies  Allergen Reactions   . Shellfish Allergy Swelling      The patient states she uses {contraceptive methods:5051} for birth control. Last LMP was No LMP recorded.. {Dysmenorrhea-menorrhagia:21918}. Negative for: breast discharge, breast lump(s), breast pain and breast self exam. Associated symptoms include abnormal vaginal bleeding. Pertinent negatives include abnormal bleeding (hematology), anxiety, decreased libido, depression, difficulty falling sleep, dyspareunia, history of infertility, nocturia, sexual dysfunction, sleep disturbances, urinary incontinence, urinary urgency, vaginal discharge and vaginal itching. Diet regular.The patient states her exercise level is    . The patient's tobacco use is:  Social History   Tobacco Use  Smoking Status Never  Smokeless Tobacco Never  . She has been exposed to passive smoke. The patient's alcohol use is:  Social History   Substance and Sexual Activity  Alcohol Use No  . Alcohol/week: 0.0 standard drinks of alcohol  . Additional information: Last pap ***, next one scheduled for ***.    Review of Systems   There were no vitals filed for this visit. There is no height or weight on file to calculate BMI.   Objective:  Physical Exam      Assessment And Plan:     1. Encounter for general adult medical examination w/o abnormal findings  2. Essential hypertension  3. Other long term (current) drug therapy    No  follow-ups on file. Patient was given opportunity to ask questions. Patient verbalized understanding of the plan and was able to repeat key elements of the plan. All questions were answered to their satisfaction.   Marlyn Corporal, CMA   I, Marlyn Corporal, CMA, have reviewed all documentation for this visit. The documentation on 07/29/22 for the exam, diagnosis, procedures, and orders are all accurate and complete.   THE PATIENT IS ENCOURAGED TO PRACTICE SOCIAL DISTANCING DUE TO THE COVID-19 PANDEMIC.

## 2022-08-07 ENCOUNTER — Ambulatory Visit: Payer: 59

## 2022-08-07 NOTE — Progress Notes (Unsigned)
Patient presents today to pick up custom molded foot orthotics recommended by Dr. HYATT.   Orthotics were dispensed and fit was satisfactory. Reviewed instructions for break-in and wear. Written instructions given to patient.  Patient will follow up as needed.     

## 2022-08-17 ENCOUNTER — Other Ambulatory Visit: Payer: Self-pay | Admitting: Nurse Practitioner

## 2022-08-17 DIAGNOSIS — I10 Essential (primary) hypertension: Secondary | ICD-10-CM

## 2022-08-19 ENCOUNTER — Other Ambulatory Visit: Payer: Self-pay

## 2022-08-19 DIAGNOSIS — I10 Essential (primary) hypertension: Secondary | ICD-10-CM

## 2022-08-19 MED ORDER — AMLODIPINE BESYLATE 10 MG PO TABS
10.00 mg | ORAL_TABLET | Freq: Every day | ORAL | 0 refills | Status: DC
Start: 2022-08-19 — End: 2022-09-19

## 2022-08-19 NOTE — Progress Notes (Unsigned)
Patient presents today to pick up custom orthotic insoles.   Fit was satisfactory. Instructions for break-in and wear was reviewed and a copy was given to the patient.   Re-appointment for regularly scheduled diabetic foot care visits or if they should experience any trouble with the shoes or insoles.

## 2022-08-27 ENCOUNTER — Ambulatory Visit: Payer: 59 | Admitting: Nurse Practitioner

## 2022-08-27 ENCOUNTER — Encounter: Payer: Self-pay | Admitting: Nurse Practitioner

## 2022-08-27 VITALS — BP 100/60 | HR 61 | Temp 98.4°F | Ht 64.0 in | Wt 217.8 lb

## 2022-08-27 DIAGNOSIS — R0683 Snoring: Secondary | ICD-10-CM

## 2022-08-27 DIAGNOSIS — I10 Essential (primary) hypertension: Secondary | ICD-10-CM

## 2022-08-27 DIAGNOSIS — Z6837 Body mass index (BMI) 37.0-37.9, adult: Secondary | ICD-10-CM

## 2022-08-27 DIAGNOSIS — R2242 Localized swelling, mass and lump, left lower limb: Secondary | ICD-10-CM

## 2022-08-27 DIAGNOSIS — R5383 Other fatigue: Secondary | ICD-10-CM

## 2022-08-27 DIAGNOSIS — Z2821 Immunization not carried out because of patient refusal: Secondary | ICD-10-CM

## 2022-08-27 NOTE — Progress Notes (Signed)
I,Ellissa Ayo,acting as a Neurosurgeon for Arnette Felts, FNP.,have documented all relevant documentation on the behalf of Arnette Felts, FNP,as directed by  Arnette Felts, FNP while in the presence of Arnette Felts, FNP.  Subjective:  Patient ID: Sherry Sampson , female    DOB: April 17, 1970 , 52 y.o.   MRN: [id number]  Chief Complaint  Patient presents with   Hypertension    HPI  Patient presents today for a bp check, patient reports compliance with medications and has no other concerns today. Patient denies any chest pain, SOB, or headaches. She is drinking more water.   She has been having fatigue worse around 12-1pm. GYN thinks may be related to menopause her last menstrual cycle was in December 2023. If she sits still she can fall asleep easily. She is sleepy even after sleeping 6 hours per night (her average)  She has just gotten her inserts to left foot. She walks on hard concrete and has been causing pain to her foot.   BP Readings from Last 3 Encounters: 08/27/22 : 100/60 02/26/22 : 128/76 02/26/22 : 138/80   Wt Readings from Last 3 Encounters: 08/27/22 : 217 lb 12.8 oz (98.8 kg) 02/26/22 : 216 lb 14.4 oz (98.4 kg) 01/22/22 : 214 lb (97.1 kg)  She sees Dr. Elesa Massed in [place] who will order her mammogram with her PAP next month.       Past Medical History:  Diagnosis Date   Heart murmur    Hypertension    no meds.. pt states long time ago   Leiomyoma 2013   11x17mm, 10x102mm   Lower extremity edema 02/26/2022     Family History  Problem Relation Age of Onset   Hypertension Father    Heart murmur Father    Stroke Father    Heart attack Maternal Grandmother    Diabetes Maternal Grandmother    Cancer Paternal Grandmother        COLON   Heart murmur Daughter      Current Outpatient Medications:    amLODipine (NORVASC) 10 MG tablet, Take 1 tablet (10 mg total) by mouth daily., Disp: 30 tablet, Rfl: 0   Blood Pressure Monitoring (BLOOD PRESS MONITOR/M-L CUFF) MISC, 1 Device  by Does not apply route every morning., Disp: 1 each, Rfl: 0   EPINEPHrine 0.3 mg/0.3 mL IJ SOAJ injection, Inject 0.3 mg into the muscle as needed for anaphylaxis., Disp: 1 each, Rfl: 2   meloxicam (MOBIC) 15 MG tablet, Take 1 tablet (15 mg total) by mouth daily., Disp: 30 tablet, Rfl: 3   spironolactone (ALDACTONE) 25 MG tablet, TAKE 1 TABLET (25 MG TOTAL) BY MOUTH DAILY., Disp: 30 tablet, Rfl: 2  Current Facility-Administered Medications:    dexamethasone (DECADRON) injection 2 mg, 2 mg, Intra-articular, Once, Hyatt, Max T, DPM   Allergies  Allergen Reactions   Shellfish Allergy Swelling     Review of Systems  Constitutional:  Positive for fatigue.  HENT: Negative.    Eyes: Negative.   Respiratory: Negative.    Cardiovascular: Negative.   Gastrointestinal: Negative.   Neurological: Negative.   Psychiatric/Behavioral: Negative.       Today's Vitals   08/27/22 1419  BP: 100/60  Pulse: 61  Temp: 98.4 F (36.9 C)  TempSrc: Oral  Weight: 217 lb 12.8 oz (98.8 kg)  Height: 5\' 4"  (1.626 m)  PainSc: 0-No pain   Body mass index is 37.39 kg/m.  Wt Readings from Last 3 Encounters:  08/27/22 217 lb 12.8 oz (98.8 kg)  02/26/22 216 lb 14.4 oz (98.4 kg)  01/22/22 214 lb (97.1 kg)    The ASCVD Risk score (Arnett DK, et al., 2019) failed to calculate for the following reasons:   Cannot find a previous HDL lab   Cannot find a previous total cholesterol lab  Objective:  Physical Exam Vitals reviewed.  Constitutional:      General: She is not in acute distress.    Appearance: Normal appearance. She is obese.  Cardiovascular:     Rate and Rhythm: Normal rate and regular rhythm.     Pulses: Normal pulses.     Heart sounds: Normal heart sounds. No murmur heard. Pulmonary:     Effort: Pulmonary effort is normal. No respiratory distress.     Breath sounds: Normal breath sounds. No wheezing.  Skin:    General: Skin is warm and dry.     Capillary Refill: Capillary refill takes  less than 2 seconds.  Neurological:     General: No focal deficit present.     Mental Status: She is alert and oriented to person, place, and time.     Cranial Nerves: No cranial nerve deficit.         Assessment And Plan:  1. Essential hypertension Comments: Blood pressure is well controlled. Continue current medications - Basic metabolic panel  2. Fatigue, unspecified type Comments: Will check for metabolic causes. If normal will refer for sleep study - Iron, TIBC and Ferritin Panel - Hemoglobin A1c - TSH - VITAMIN D 25 Hydroxy (Vit-D Deficiency, Fractures) - Vitamin B12 - CBC  3. Snoring Comments: She may benefit from a sleep study, will check for metabolic causes of her fatigue and encourage weight loss  4. Localized swelling of left foot Comments: Has no cartilage to her left toe causing her swelling and pain. Wearing work boats. Continue f/u with podiatry.  5. Herpes zoster vaccination declined  6. COVID-19 vaccination declined  7. Class 2 severe obesity due to excess calories with serious comorbidity and body mass index (BMI) of 37.0 to 37.9 in adult [facility]) She is encouraged to strive for BMI less than 30 to decrease cardiac risk. Advised to aim for at least 150 minutes of exercise per week.   Return for sch phy .  Patient was given opportunity to ask questions. Patient verbalized understanding of the plan and was able to repeat key elements of the plan. All questions were answered to their satisfaction.  Arnette Felts, FNP  I, Arnette Felts, FNP, have reviewed all documentation for this visit. The documentation on 08/27/22 for the exam, diagnosis, procedures, and orders are all accurate and complete.   IF YOU HAVE BEEN REFERRED TO A SPECIALIST, IT MAY TAKE 1-2 WEEKS TO SCHEDULE/PROCESS THE REFERRAL. IF YOU HAVE NOT HEARD FROM US/SPECIALIST IN TWO WEEKS, PLEASE GIVE [place] A CALL AT [phone].

## 2022-08-28 LAB — BASIC METABOLIC PANEL
BUN/Creatinine Ratio: 15 (ref 9–23)
BUN: 12 mg/dL (ref 6–24)
CO2: 23 mmol/L (ref 20–29)
Calcium: 9.1 mg/dL (ref 8.7–10.2)
Chloride: 101 mmol/L (ref 96–106)
Creatinine, Ser: 0.82 mg/dL (ref 0.57–1.00)
Glucose: 81 mg/dL (ref 70–99)
Potassium: 4 mmol/L (ref 3.5–5.2)
Sodium: 135 mmol/L (ref 134–144)
eGFR: 87 mL/min/{1.73_m2} (ref >59)

## 2022-08-28 LAB — CBC
Hematocrit: 38.3 % (ref 34.0–46.6)
Hemoglobin: 13.1 g/dL (ref 11.1–15.9)
MCH: 29.1 pg (ref 26.6–33.0)
MCHC: 34.2 g/dL (ref 31.5–35.7)
MCV: 85 fL (ref 79–97)
Platelets: 282 10*3/uL (ref 150–450)
RBC: 4.5 x10E6/uL (ref 3.77–5.28)
RDW: 13.2 % (ref 11.7–15.4)
WBC: 7.8 10*3/uL (ref 3.4–10.8)

## 2022-08-28 LAB — IRON,TIBC AND FERRITIN PANEL
Ferritin: 49 ng/mL (ref 15–150)
Iron Saturation: 30 % (ref 15–55)
Iron: 94 ug/dL (ref 27–159)
Total Iron Binding Capacity: 316 ug/dL (ref 250–450)
UIBC: 222 ug/dL (ref 131–425)

## 2022-08-28 LAB — HEMOGLOBIN A1C
Est. average glucose Bld gHb Est-mCnc: 111 mg/dL
Hgb A1c MFr Bld: 5.5 % (ref 4.8–5.6)

## 2022-08-28 LAB — TSH: TSH: 0.973 u[IU]/mL (ref 0.450–4.500)

## 2022-08-28 LAB — VITAMIN D 25 HYDROXY (VIT D DEFICIENCY, FRACTURES): Vit D, 25-Hydroxy: 21 ng/mL — ABNORMAL LOW (ref 30.0–100.0)

## 2022-08-28 LAB — VITAMIN B12: Vitamin B-12: 655 pg/mL (ref 232–1245)

## 2022-09-05 MED ORDER — VITAMIN D (ERGOCALCIFEROL) 1.25 MG (50000 UNIT) PO CAPS: 50000.0000 [IU] | ORAL_CAPSULE | ORAL | 1 refills | Status: DC

## 2022-09-17 ENCOUNTER — Other Ambulatory Visit: Payer: Self-pay | Admitting: Nurse Practitioner

## 2022-09-17 ENCOUNTER — Other Ambulatory Visit: Payer: Self-pay | Admitting: Podiatry

## 2022-09-17 DIAGNOSIS — I10 Essential (primary) hypertension: Secondary | ICD-10-CM

## 2022-10-13 ENCOUNTER — Other Ambulatory Visit: Payer: Self-pay

## 2022-10-13 MED ORDER — SPIRONOLACTONE 25 MG PO TABS: 25.0000 mg | ORAL_TABLET | Freq: Every day | ORAL | 2 refills | Status: DC

## 2022-10-20 ENCOUNTER — Other Ambulatory Visit: Payer: Self-pay | Admitting: Nurse Practitioner

## 2022-10-20 DIAGNOSIS — I10 Essential (primary) hypertension: Secondary | ICD-10-CM

## 2022-11-23 ENCOUNTER — Other Ambulatory Visit: Payer: Self-pay | Admitting: Nurse Practitioner

## 2022-11-23 DIAGNOSIS — I10 Essential (primary) hypertension: Secondary | ICD-10-CM

## 2022-12-01 ENCOUNTER — Other Ambulatory Visit: Payer: Self-pay

## 2022-12-23 ENCOUNTER — Other Ambulatory Visit: Payer: Self-pay | Admitting: Nurse Practitioner

## 2022-12-23 ENCOUNTER — Encounter: Payer: 59 | Admitting: Nurse Practitioner

## 2022-12-23 DIAGNOSIS — I10 Essential (primary) hypertension: Secondary | ICD-10-CM

## 2022-12-23 NOTE — Progress Notes (Deleted)
Madelaine Bhat, CMA,acting as a Neurosurgeon for Arnette Felts, FNP.,have documented all relevant documentation on the behalf of Arnette Felts, FNP,as directed by  Arnette Felts, FNP while in the presence of Arnette Felts, FNP.  Subjective:    Patient ID: Sherry Sampson , female    DOB: 03-Jan-1971 , 52 y.o.   MRN: [id number]  No chief complaint on file.   HPI  Patient presents today for HM, Patient reports compliance with medication. Patient denies any chest pain, SOB, or headaches. Patient has no concerns today.     Past Medical History:  Diagnosis Date  . Heart murmur   . Hypertension    no meds.. pt states long time ago  . Leiomyoma 2013   11x33mm, 10x39mm  . Lower extremity edema 02/26/2022     Family History  Problem Relation Age of Onset  . Hypertension Father   . Heart murmur Father   . Stroke Father   . Heart attack Maternal Grandmother   . Diabetes Maternal Grandmother   . Cancer Paternal Grandmother        COLON  . Heart murmur Daughter      Current Outpatient Medications:  .  amLODipine (NORVASC) 10 MG tablet, TAKE 1 TABLET BY MOUTH EVERY DAY, Disp: 30 tablet, Rfl: 0 .  Blood Pressure Monitoring (BLOOD PRESS MONITOR/M-L CUFF) MISC, 1 Device by Does not apply route every morning., Disp: 1 each, Rfl: 0 .  EPINEPHrine 0.3 mg/0.3 mL IJ SOAJ injection, Inject 0.3 mg into the muscle as needed for anaphylaxis., Disp: 1 each, Rfl: 2 .  meloxicam (MOBIC) 15 MG tablet, TAKE 1 TABLET (15 MG TOTAL) BY MOUTH DAILY., Disp: 30 tablet, Rfl: 3 .  spironolactone (ALDACTONE) 25 MG tablet, Take 1 tablet (25 mg total) by mouth daily., Disp: 90 tablet, Rfl: 2 .  Vitamin D, Ergocalciferol, (DRISDOL) 1.25 MG (50000 UNIT) CAPS capsule, Take 1 capsule (50,000 Units total) by mouth 2 (two) times a week., Disp: 24 capsule, Rfl: 1  Current Facility-Administered Medications:  .  dexamethasone (DECADRON) injection 2 mg, 2 mg, Intra-articular, Once, Hyatt, Max T, DPM   Allergies  Allergen Reactions   . Shellfish Allergy Swelling      The patient states she uses {contraceptive methods:5051} for birth control. No LMP recorded.. {Dysmenorrhea-menorrhagia:21918}. Negative for: breast discharge, breast lump(s), breast pain and breast self exam. Associated symptoms include abnormal vaginal bleeding. Pertinent negatives include abnormal bleeding (hematology), anxiety, decreased libido, depression, difficulty falling sleep, dyspareunia, history of infertility, nocturia, sexual dysfunction, sleep disturbances, urinary incontinence, urinary urgency, vaginal discharge and vaginal itching. Diet regular.The patient states her exercise level is    . The patient's tobacco use is:  Social History   Tobacco Use  Smoking Status Never  Smokeless Tobacco Never  . She has been exposed to passive smoke. The patient's alcohol use is:  Social History   Substance and Sexual Activity  Alcohol Use No  . Alcohol/week: 0.0 standard drinks of alcohol  . Additional information: Last pap ***, next one scheduled for ***.    Review of Systems  Constitutional: Negative.   HENT: Negative.    Eyes: Negative.   Respiratory: Negative.    Cardiovascular: Negative.   Gastrointestinal: Negative.   Endocrine: Negative.   Genitourinary: Negative.   Musculoskeletal: Negative.   Skin: Negative.   Allergic/Immunologic: Negative.   Neurological: Negative.   Hematological: Negative.   Psychiatric/Behavioral: Negative.      There were no vitals filed for this visit. There is no  height or weight on file to calculate BMI.  Wt Readings from Last 3 Encounters:  08/27/22 217 lb 12.8 oz (98.8 kg)  02/26/22 216 lb 14.4 oz (98.4 kg)  01/22/22 214 lb (97.1 kg)     Objective:  Physical Exam      Assessment And Plan:     Encounter for annual health examination  Essential hypertension     No follow-ups on file. Patient was given opportunity to ask questions. Patient verbalized understanding of the plan and was able  to repeat key elements of the plan. All questions were answered to their satisfaction.   Arnette Felts, FNP  I, Arnette Felts, FNP, have reviewed all documentation for this visit. The documentation on 12/23/22 for the exam, diagnosis, procedures, and orders are all accurate and complete.

## 2023-01-16 ENCOUNTER — Other Ambulatory Visit: Payer: Self-pay | Admitting: Nurse Practitioner

## 2023-01-16 DIAGNOSIS — I10 Essential (primary) hypertension: Secondary | ICD-10-CM

## 2023-01-20 ENCOUNTER — Other Ambulatory Visit: Payer: Self-pay

## 2023-01-20 ENCOUNTER — Telehealth: Payer: Self-pay

## 2023-01-20 DIAGNOSIS — I10 Essential (primary) hypertension: Secondary | ICD-10-CM

## 2023-01-20 MED ORDER — AMLODIPINE BESYLATE 10 MG PO TABS: 10.0000 mg | ORAL_TABLET | Freq: Every day | ORAL | 0 refills | Status: DC

## 2023-01-20 MED ORDER — SPIRONOLACTONE 25 MG PO TABS: 25.0000 mg | ORAL_TABLET | Freq: Every day | ORAL | 0 refills | Status: DC

## 2023-01-20 NOTE — Telephone Encounter (Signed)
Patient called requesting a refill on her bp meds. I returned pt call and advised her she needs to come in for an appointment. We scheduled appt and she was advised that she needs to keep this appointment or she will be discharged from the practice due to her not keeping her appointments. YL,RMA

## 2023-01-23 ENCOUNTER — Other Ambulatory Visit: Payer: Self-pay | Admitting: Podiatry

## 2023-01-24 ENCOUNTER — Other Ambulatory Visit: Payer: Self-pay | Admitting: Nurse Practitioner

## 2023-01-24 DIAGNOSIS — I10 Essential (primary) hypertension: Secondary | ICD-10-CM

## 2023-01-27 ENCOUNTER — Ambulatory Visit: Payer: 59 | Admitting: Nurse Practitioner

## 2023-01-27 ENCOUNTER — Other Ambulatory Visit: Payer: Self-pay | Admitting: Nurse Practitioner

## 2023-01-27 ENCOUNTER — Encounter: Payer: Self-pay | Admitting: Nurse Practitioner

## 2023-01-27 VITALS — BP 128/70 | HR 66 | Temp 98.5°F | Ht 64.0 in | Wt 217.6 lb

## 2023-01-27 DIAGNOSIS — E66812 Obesity, class 2: Secondary | ICD-10-CM

## 2023-01-27 DIAGNOSIS — Z2821 Immunization not carried out because of patient refusal: Secondary | ICD-10-CM

## 2023-01-27 DIAGNOSIS — I10 Essential (primary) hypertension: Secondary | ICD-10-CM | POA: Diagnosis not present

## 2023-01-27 DIAGNOSIS — Z6837 Body mass index (BMI) 37.0-37.9, adult: Secondary | ICD-10-CM

## 2023-01-27 DIAGNOSIS — E559 Vitamin D deficiency, unspecified: Secondary | ICD-10-CM

## 2023-01-27 MED ORDER — WEGOVY 0.5 MG/0.5ML ~~LOC~~ SOAJ: 0.5000 mg | SUBCUTANEOUS | 1 refills | Status: AC

## 2023-01-27 MED ORDER — VITAMIN D (ERGOCALCIFEROL) 1.25 MG (50000 UNIT) PO CAPS
50000.0000 [IU] | ORAL_CAPSULE | ORAL | 1 refills | Status: AC
Start: 2023-01-29 — End: ?

## 2023-01-27 NOTE — Progress Notes (Signed)
Madelaine Bhat, CMA,acting as a Neurosurgeon for Arnette Felts, FNP.,have documented all relevant documentation on the behalf of Arnette Felts, FNP,as directed by  Arnette Felts, FNP while in the presence of Arnette Felts, FNP.  Subjective:  Patient ID: Sherry Sampson , female    DOB: 07-09-1970 , 52 y.o.   MRN: [id number]  Chief Complaint  Patient presents with   Hypertension    HPI  Patient presents today for a bp follow up, Patient reports compliance with medication. Patient denies any chest pain, SOB, or headaches. Patient has no concerns today. She is exercising 2 times a week 1 hour per time for about one month. She is doing the treadmill, elliptical and some weights. She does not cook much, more convenient to pick up foods. She tries to eat chicken wraps, tries to avoid hamburgers however her downfalls are fries. She eats two times a day. She begins eating about 12p and stops about 830p. When she eats in the evening   She has not tried any other medications for weight loss. Her weight loss goals she would like to lose 20 lbs. She is planning a wedding for next year.   BP Readings from Last 3 Encounters: 01/27/23 : 128/70 08/27/22 : 100/60 02/26/22 : 128/76       Past Medical History:  Diagnosis Date   Heart murmur    Hypertension    no meds.. pt states long time ago   Leiomyoma 2013   11x69mm, 10x64mm   Lower extremity edema 02/26/2022     Family History  Problem Relation Age of Onset   Hypertension Father    Heart murmur Father    Stroke Father    Heart attack Maternal Grandmother    Diabetes Maternal Grandmother    Cancer Paternal Grandmother        COLON   Heart murmur Daughter      Current Outpatient Medications:    amLODipine (NORVASC) 10 MG tablet, Take 1 tablet (10 mg total) by mouth daily., Disp: 30 tablet, Rfl: 0   Blood Pressure Monitoring (BLOOD PRESS MONITOR/M-L CUFF) MISC, 1 Device by Does not apply route every morning., Disp: 1 each, Rfl: 0   EPINEPHrine 0.3  mg/0.3 mL IJ SOAJ injection, Inject 0.3 mg into the muscle as needed for anaphylaxis., Disp: 1 each, Rfl: 2   meloxicam (MOBIC) 15 MG tablet, TAKE 1 TABLET (15 MG TOTAL) BY MOUTH DAILY., Disp: 30 tablet, Rfl: 3   Semaglutide-Weight Management (WEGOVY) 0.5 MG/0.5ML SOAJ, Inject 0.5 mg into the skin once a week., Disp: 2 mL, Rfl: 1   spironolactone (ALDACTONE) 25 MG tablet, TAKE 1/2 TABLET DAILY BY MOUTH IN AM, Disp: 90 tablet, Rfl: 2   Vitamin D, Ergocalciferol, (DRISDOL) 1.25 MG (50000 UNIT) CAPS capsule, Take 1 capsule (50,000 Units total) by mouth 2 (two) times a week., Disp: 24 capsule, Rfl: 1  Current Facility-Administered Medications:    dexamethasone (DECADRON) injection 2 mg, 2 mg, Intra-articular, Once, Hyatt, Max T, DPM   Allergies  Allergen Reactions   Shellfish Allergy Swelling     Review of Systems  Constitutional: Negative.   HENT: Negative.    Eyes: Negative.   Respiratory: Negative.    Cardiovascular: Negative.   Gastrointestinal: Negative.   Neurological: Negative.   Psychiatric/Behavioral: Negative.       Today's Vitals   01/27/23 0957  BP: 128/70  Pulse: 66  Temp: 98.5 F (36.9 C)  TempSrc: Oral  Weight: 217 lb 9.6 oz (98.7 kg)  Height: 5\' 4"  (1.626 m)  PainSc: 0-No pain   Body mass index is 37.35 kg/m.  Wt Readings from Last 3 Encounters:  01/27/23 217 lb 9.6 oz (98.7 kg)  08/27/22 217 lb 12.8 oz (98.8 kg)  02/26/22 216 lb 14.4 oz (98.4 kg)     Objective:  Physical Exam Vitals reviewed.  Constitutional:      General: She is not in acute distress.    Appearance: Normal appearance. She is obese.  Cardiovascular:     Rate and Rhythm: Normal rate and regular rhythm.     Pulses: Normal pulses.     Heart sounds: Normal heart sounds. No murmur heard. Pulmonary:     Effort: Pulmonary effort is normal. No respiratory distress.     Breath sounds: Normal breath sounds. No wheezing.  Skin:    General: Skin is warm and dry.     Capillary Refill:  Capillary refill takes less than 2 seconds.  Neurological:     General: No focal deficit present.     Mental Status: She is alert and oriented to person, place, and time.     Cranial Nerves: No cranial nerve deficit.     Motor: No weakness.         Assessment And Plan:  Essential hypertension Assessment & Plan: Blood pressure is well-controlled.  Continue current medications.  Orders: -     Basic metabolic panel  Vitamin D deficiency Assessment & Plan: Will check vitamin D level and supplement as needed.    Also encouraged to spend 15 minutes in the sun daily.    Orders: -     Vitamin D (Ergocalciferol); Take 1 capsule (50,000 Units total) by mouth 2 (two) times a week.  Dispense: 24 capsule; Refill: 1  Influenza vaccination declined Assessment & Plan: Patient declined influenza vaccination at this time. Patient is aware that influenza vaccine prevents illness in 70% of healthy people, and reduces hospitalizations to 30-70% in elderly. This vaccine is recommended annually. Education has been provided regarding the importance of this vaccine but patient still declined. Advised may receive this vaccine at local pharmacy or Health Dept.or vaccine clinic. Aware to provide a copy of the vaccination record if obtained from local pharmacy or Health Dept.  Pt is willing to accept risk associated with refusing vaccination.    Herpes zoster vaccination declined Assessment & Plan: Declines shingrix, educated on disease process and is aware if he changes his mind to notify office    COVID-19 vaccination declined Assessment & Plan: Declines covid 19 vaccine. Discussed risk of covid 68 and if she changes her mind about the vaccine to call the office. Education has been provided regarding the importance of this vaccine but patient still declined. Advised may receive this vaccine at local pharmacy or Health Dept.or vaccine clinic. Aware to provide a copy of the vaccination record if obtained  from local pharmacy or Health Dept.  Encouraged to take multivitamin, vitamin d, vitamin c and zinc to increase immune system. Aware can call office if would like to have vaccine here at office. Verbalized acceptance and understanding.     Class 2 severe obesity due to excess calories with serious comorbidity and body mass index (BMI) of 37.0 to 37.9 in adult [facility]) Assessment & Plan: She is willing to start wegovy pending insurance approval. If approved she will make [place] aware so we can provide her with a sample and teaching. If you have any stomach pain or difficulty swallowing call to office [facility]  may cause nausea allow time for this to improve Goal to lose 1-2 lbs per week Increase your physical activity and incorporate 2 days of strength training.   Orders: -     Wegovy; Inject 0.5 mg into the skin once a week.  Dispense: 2 mL; Refill: 1    Return for 6 month bp check; weight check 8-10 weeks.  Patient was given opportunity to ask questions. Patient verbalized understanding of the plan and was able to repeat key elements of the plan. All questions were answered to their satisfaction.    Jeanell Sparrow, FNP, have reviewed all documentation for this visit. The documentation on 01/27/23 for the exam, diagnosis, procedures, and orders are all accurate and complete.   IF YOU HAVE BEEN REFERRED TO A SPECIALIST, IT MAY TAKE 1-2 WEEKS TO SCHEDULE/PROCESS THE REFERRAL. IF YOU HAVE NOT HEARD FROM US/SPECIALIST IN TWO WEEKS, PLEASE GIVE [place] A CALL AT [phone].

## 2023-01-27 NOTE — Patient Instructions (Addendum)
-   [email or web address] for information on Wegovy - make sure to eat a high protein, high fiber diet.  - drink at least 64 oz per day.  - you can take a stool softner for any constipation - If you have any stomach pain or difficulty swallowing call to office - Wegovy may cause nausea allow time for this to improve - Goal to lose 1-2 lbs per week - Increase your physical activity and incorporate 2 days of strength training.

## 2023-01-28 LAB — BASIC METABOLIC PANEL
BUN/Creatinine Ratio: 12 (ref 9–23)
BUN: 9 mg/dL (ref 6–24)
CO2: 23 mmol/L (ref 20–29)
Calcium: 9.7 mg/dL (ref 8.7–10.2)
Chloride: 101 mmol/L (ref 96–106)
Creatinine, Ser: 0.74 mg/dL (ref 0.57–1.00)
Glucose: 65 mg/dL — ABNORMAL LOW (ref 70–99)
Potassium: 4.1 mmol/L (ref 3.5–5.2)
Sodium: 137 mmol/L (ref 134–144)
eGFR: 99 mL/min/{1.73_m2} (ref >59)

## 2023-02-06 ENCOUNTER — Telehealth: Payer: Self-pay

## 2023-02-06 NOTE — Telephone Encounter (Signed)
PA for wegovy sent to plan.

## 2023-02-07 DIAGNOSIS — Z2821 Immunization not carried out because of patient refusal: Secondary | ICD-10-CM | POA: Insufficient documentation

## 2023-02-07 NOTE — Assessment & Plan Note (Signed)
She is willing to start wegovy pending insurance approval. If approved she will make [place] aware so we can provide her with a sample and teaching. If you have any stomach pain or difficulty swallowing call to office [facility] may cause nausea allow time for this to improve Goal to lose 1-2 lbs per week Increase your physical activity and incorporate 2 days of strength training.

## 2023-02-07 NOTE — Assessment & Plan Note (Signed)
Declines shingrix, educated on disease process and is aware if he changes his mind to notify office  

## 2023-02-07 NOTE — Assessment & Plan Note (Signed)
Will check vitamin D level and supplement as needed.    Also encouraged to spend 15 minutes in the sun daily.   

## 2023-02-07 NOTE — Assessment & Plan Note (Signed)

## 2023-02-07 NOTE — Assessment & Plan Note (Signed)

## 2023-02-07 NOTE — Assessment & Plan Note (Signed)
Blood pressure is well controlled. Continue current medications.

## 2023-02-14 ENCOUNTER — Other Ambulatory Visit: Payer: Self-pay | Admitting: Nurse Practitioner

## 2023-02-14 DIAGNOSIS — I10 Essential (primary) hypertension: Secondary | ICD-10-CM

## 2023-03-15 ENCOUNTER — Other Ambulatory Visit: Payer: Self-pay | Admitting: Nurse Practitioner

## 2023-03-15 DIAGNOSIS — I10 Essential (primary) hypertension: Secondary | ICD-10-CM

## 2023-03-25 ENCOUNTER — Ambulatory Visit: Payer: 59 | Admitting: Nurse Practitioner

## 2023-04-08 ENCOUNTER — Ambulatory Visit: Payer: 59 | Admitting: Nurse Practitioner

## 2023-04-08 NOTE — Progress Notes (Deleted)
Madelaine Bhat, CMA,acting as a Neurosurgeon for Arnette Felts, FNP.,have documented all relevant documentation on the behalf of Arnette Felts, FNP,as directed by  Arnette Felts, FNP while in the presence of Arnette Felts, FNP.  Subjective:  Patient ID: Sherry Sampson , female    DOB: Aug 01, 1970 , 53 y.o.   MRN: [id number]  No chief complaint on file.   HPI  HPI   Past Medical History:  Diagnosis Date   Heart murmur    Hypertension    no meds.. pt states long time ago   Leiomyoma 2013   11x62mm, 10x62mm   Lower extremity edema 02/26/2022     Family History  Problem Relation Age of Onset   Hypertension Father    Heart murmur Father    Stroke Father    Heart attack Maternal Grandmother    Diabetes Maternal Grandmother    Cancer Paternal Grandmother        COLON   Heart murmur Daughter      Current Outpatient Medications:    amLODipine (NORVASC) 10 MG tablet, TAKE 1 TABLET BY MOUTH EVERY DAY, Disp: 30 tablet, Rfl: 0   Blood Pressure Monitoring (BLOOD PRESS MONITOR/M-L CUFF) MISC, 1 Device by Does not apply route every morning., Disp: 1 each, Rfl: 0   EPINEPHrine 0.3 mg/0.3 mL IJ SOAJ injection, Inject 0.3 mg into the muscle as needed for anaphylaxis., Disp: 1 each, Rfl: 2   meloxicam (MOBIC) 15 MG tablet, TAKE 1 TABLET (15 MG TOTAL) BY MOUTH DAILY., Disp: 30 tablet, Rfl: 3   Semaglutide-Weight Management (WEGOVY) 0.5 MG/0.5ML SOAJ, Inject 0.5 mg into the skin once a week., Disp: 2 mL, Rfl: 1   spironolactone (ALDACTONE) 25 MG tablet, TAKE 1/2 TABLET DAILY BY MOUTH IN AM, Disp: 90 tablet, Rfl: 2   Vitamin D, Ergocalciferol, (DRISDOL) 1.25 MG (50000 UNIT) CAPS capsule, Take 1 capsule (50,000 Units total) by mouth 2 (two) times a week., Disp: 24 capsule, Rfl: 1  Current Facility-Administered Medications:    dexamethasone (DECADRON) injection 2 mg, 2 mg, Intra-articular, Once, Hyatt, Max T, DPM   Allergies  Allergen Reactions   Shellfish Allergy Swelling     Review of Systems    There were no vitals filed for this visit. There is no height or weight on file to calculate BMI.  Wt Readings from Last 3 Encounters:  01/27/23 217 lb 9.6 oz (98.7 kg)  08/27/22 217 lb 12.8 oz (98.8 kg)  02/26/22 216 lb 14.4 oz (98.4 kg)    The ASCVD Risk score (Arnett DK, et al., 2019) failed to calculate for the following reasons:   Cannot find a previous HDL lab   Cannot find a previous total cholesterol lab  Objective:  Physical Exam      Assessment And Plan:  There are no diagnoses linked to this encounter.  No follow-ups on file.  Patient was given opportunity to ask questions. Patient verbalized understanding of the plan and was able to repeat key elements of the plan. All questions were answered to their satisfaction.    Jeanell Sparrow, FNP, have reviewed all documentation for this visit. The documentation on 04/08/23 for the exam, diagnosis, procedures, and orders are all accurate and complete.   IF YOU HAVE BEEN REFERRED TO A SPECIALIST, IT MAY TAKE 1-2 WEEKS TO SCHEDULE/PROCESS THE REFERRAL. IF YOU HAVE NOT HEARD FROM US/SPECIALIST IN TWO WEEKS, PLEASE GIVE [place] A CALL AT [phone].

## 2023-04-12 ENCOUNTER — Other Ambulatory Visit: Payer: Self-pay | Admitting: Nurse Practitioner

## 2023-04-12 DIAGNOSIS — I10 Essential (primary) hypertension: Secondary | ICD-10-CM

## 2023-05-19 ENCOUNTER — Other Ambulatory Visit: Payer: Self-pay | Admitting: Podiatry

## 2023-07-27 NOTE — Progress Notes (Deleted)
 [place], CMA,acting as a Neurosurgeon for Susanna Epley, FNP.,have documented all relevant documentation on the behalf of Susanna Epley, FNP,as directed by  Susanna Epley, FNP while in the presence of Susanna Epley, FNP.  Subjective:  Patient ID: Sherry Sampson , female    DOB: [birth year] , 53 y.o.   MRN: [id number]  No chief complaint on file.   HPI  HPI   Past Medical History:  Diagnosis Date   Heart murmur    Hypertension    no meds.. pt states long time ago   Leiomyoma 2013   11x40mm, 10x50mm   Lower extremity edema 02/26/2022     Family History  Problem Relation Age of Onset   Hypertension Father    Heart murmur Father    Stroke Father    Heart attack Maternal Grandmother    Diabetes Maternal Grandmother    Cancer Paternal Grandmother        COLON   Heart murmur Daughter      Current Outpatient Medications:    amLODipine  (NORVASC ) 10 MG tablet, TAKE 1 TABLET BY MOUTH EVERY DAY, Disp: 90 tablet, Rfl: 2   Blood Pressure Monitoring (BLOOD PRESS MONITOR/M-L CUFF) MISC, 1 Device by Does not apply route every morning., Disp: 1 each, Rfl: 0   EPINEPHrine  0.3 mg/0.3 mL IJ SOAJ injection, Inject 0.3 mg into the muscle as needed for anaphylaxis., Disp: 1 each, Rfl: 2   meloxicam  (MOBIC ) 15 MG tablet, TAKE 1 TABLET (15 MG TOTAL) BY MOUTH DAILY., Disp: 30 tablet, Rfl: 3   Semaglutide -Weight Management (WEGOVY ) 0.5 MG/0.5ML SOAJ, Inject 0.5 mg into the skin once a week., Disp: 2 mL, Rfl: 1   spironolactone  (ALDACTONE ) 25 MG tablet, TAKE 1/2 TABLET DAILY BY MOUTH IN AM, Disp: 90 tablet, Rfl: 2   Vitamin D , Ergocalciferol , (DRISDOL ) 1.25 MG (50000 UNIT) CAPS capsule, Take 1 capsule (50,000 Units total) by mouth 2 (two) times a week., Disp: 24 capsule, Rfl: 1  Current Facility-Administered Medications:    dexamethasone  (DECADRON ) injection 2 mg, 2 mg, Intra-articular, Once, Hyatt, Max T, DPM   Allergies  Allergen Reactions   Shellfish Allergy Swelling     Review of Systems    There were no vitals filed for this visit. There is no height or weight on file to calculate BMI.  Wt Readings from Last 3 Encounters:  01/27/23 217 lb 9.6 oz (98.7 kg)  08/27/22 217 lb 12.8 oz (98.8 kg)  02/26/22 216 lb 14.4 oz (98.4 kg)    The ASCVD Risk score (Arnett DK, et al., 2019) failed to calculate for the following reasons:   Cannot find a previous HDL lab   Cannot find a previous total cholesterol lab  Objective:  Physical Exam      Assessment And Plan:  Essential hypertension    No follow-ups on file.  Patient was given opportunity to ask questions. Patient verbalized understanding of the plan and was able to repeat key elements of the plan. All questions were answered to their satisfaction.    Inge Mangle, FNP, have reviewed all documentation for this visit. The documentation on 07/27/23 for the exam, diagnosis, procedures, and orders are all accurate and complete.   IF YOU HAVE BEEN REFERRED TO A SPECIALIST, IT MAY TAKE 1-2 WEEKS TO SCHEDULE/PROCESS THE REFERRAL. IF YOU HAVE NOT HEARD FROM US /SPECIALIST IN TWO WEEKS, PLEASE GIVE US  A CALL AT [phone].

## 2023-07-28 ENCOUNTER — Ambulatory Visit: Payer: Self-pay | Admitting: Nurse Practitioner

## 2023-07-28 DIAGNOSIS — I10 Essential (primary) hypertension: Secondary | ICD-10-CM

## 2023-09-10 ENCOUNTER — Other Ambulatory Visit: Payer: Self-pay | Admitting: Podiatry

## 2023-10-08 ENCOUNTER — Other Ambulatory Visit: Payer: Self-pay | Admitting: Nurse Practitioner

## 2023-10-08 DIAGNOSIS — I10 Essential (primary) hypertension: Secondary | ICD-10-CM

## 2023-11-03 ENCOUNTER — Other Ambulatory Visit: Payer: Self-pay | Admitting: Nurse Practitioner

## 2023-11-03 DIAGNOSIS — I10 Essential (primary) hypertension: Secondary | ICD-10-CM

## 2023-12-24 ENCOUNTER — Other Ambulatory Visit: Payer: Self-pay | Admitting: Nurse Practitioner

## 2023-12-24 DIAGNOSIS — E559 Vitamin D deficiency, unspecified: Secondary | ICD-10-CM

## 2024-01-06 ENCOUNTER — Other Ambulatory Visit: Payer: Self-pay | Admitting: Podiatry

## 2024-01-06 ENCOUNTER — Other Ambulatory Visit: Payer: Self-pay | Admitting: Nurse Practitioner

## 2024-01-06 DIAGNOSIS — I10 Essential (primary) hypertension: Secondary | ICD-10-CM

## 2024-01-29 ENCOUNTER — Other Ambulatory Visit: Payer: Self-pay | Admitting: Nurse Practitioner

## 2024-01-29 DIAGNOSIS — I10 Essential (primary) hypertension: Secondary | ICD-10-CM
